# Patient Record
Sex: Female | Born: 1935 | Race: White | Hispanic: No | Smoking: Never smoker
Health system: Southern US, Community
[De-identification: ages and names within clinical notes are randomized; demographics above are authoritative.]

## PROBLEM LIST (undated history)

## (undated) DIAGNOSIS — D649 Anemia, unspecified: Secondary | ICD-10-CM

## (undated) DIAGNOSIS — I219 Acute myocardial infarction, unspecified: Secondary | ICD-10-CM

## (undated) DIAGNOSIS — I509 Heart failure, unspecified: Secondary | ICD-10-CM

## (undated) DIAGNOSIS — E079 Disorder of thyroid, unspecified: Secondary | ICD-10-CM

## (undated) DIAGNOSIS — M1990 Unspecified osteoarthritis, unspecified site: Secondary | ICD-10-CM

## (undated) DIAGNOSIS — K219 Gastro-esophageal reflux disease without esophagitis: Secondary | ICD-10-CM

## (undated) DIAGNOSIS — J449 Chronic obstructive pulmonary disease, unspecified: Secondary | ICD-10-CM

## (undated) DIAGNOSIS — C801 Malignant (primary) neoplasm, unspecified: Secondary | ICD-10-CM

## (undated) HISTORY — PX: CORONARY ANGIOPLASTY WITH STENT PLACEMENT: SHX49

## (undated) HISTORY — PX: MASTECTOMY: SHX3

## (undated) HISTORY — PX: ABDOMINAL HYSTERECTOMY: SHX81

## (undated) HISTORY — PX: APPENDECTOMY: SHX54

## (undated) HISTORY — PX: VESICOVAGINAL FISTULA CLOSURE W/ TAH: SUR271

## (undated) HISTORY — PX: OTHER SURGICAL HISTORY: SHX169

---

## 2004-12-17 ENCOUNTER — Ambulatory Visit: Payer: Self-pay

## 2004-12-31 ENCOUNTER — Ambulatory Visit: Payer: Self-pay | Admitting: Cardiovascular Disease

## 2005-01-04 ENCOUNTER — Inpatient Hospital Stay (HOSPITAL_COMMUNITY): Admission: AD | Admit: 2005-01-04 | Discharge: 2005-01-06 | Payer: Self-pay | Admitting: Cardiology

## 2005-01-04 ENCOUNTER — Ambulatory Visit: Payer: Self-pay | Admitting: Cardiovascular Disease

## 2005-05-30 ENCOUNTER — Ambulatory Visit: Payer: Self-pay | Admitting: Cardiovascular Disease

## 2005-06-01 ENCOUNTER — Ambulatory Visit: Payer: Self-pay | Admitting: Cardiovascular Disease

## 2005-06-01 ENCOUNTER — Inpatient Hospital Stay (HOSPITAL_COMMUNITY): Admission: AD | Admit: 2005-06-01 | Discharge: 2005-06-02 | Payer: Self-pay | Admitting: Cardiology

## 2005-11-01 ENCOUNTER — Other Ambulatory Visit: Payer: Self-pay

## 2005-11-01 ENCOUNTER — Inpatient Hospital Stay: Payer: Self-pay | Admitting: Internal Medicine

## 2005-11-04 ENCOUNTER — Other Ambulatory Visit: Payer: Self-pay

## 2007-01-03 ENCOUNTER — Other Ambulatory Visit: Payer: Self-pay

## 2007-01-03 ENCOUNTER — Emergency Department: Payer: Self-pay | Admitting: Unknown Physician Specialty

## 2007-03-07 ENCOUNTER — Ambulatory Visit: Payer: Self-pay | Admitting: Gastroenterology

## 2007-06-22 ENCOUNTER — Ambulatory Visit: Payer: Self-pay | Admitting: Family Medicine

## 2008-01-11 ENCOUNTER — Ambulatory Visit: Payer: Self-pay | Admitting: Cardiovascular Disease

## 2008-01-11 ENCOUNTER — Other Ambulatory Visit: Payer: Self-pay

## 2008-06-28 ENCOUNTER — Other Ambulatory Visit: Payer: Self-pay

## 2008-06-28 ENCOUNTER — Emergency Department: Payer: Self-pay | Admitting: Emergency Medicine

## 2008-07-15 ENCOUNTER — Ambulatory Visit: Payer: Self-pay | Admitting: Family Medicine

## 2008-10-27 ENCOUNTER — Ambulatory Visit: Payer: Self-pay | Admitting: Family Medicine

## 2009-03-05 ENCOUNTER — Ambulatory Visit: Payer: Self-pay | Admitting: Gastroenterology

## 2009-11-02 ENCOUNTER — Ambulatory Visit: Payer: Self-pay | Admitting: Family Medicine

## 2009-11-16 ENCOUNTER — Ambulatory Visit: Payer: Self-pay | Admitting: Unknown Physician Specialty

## 2010-02-01 ENCOUNTER — Observation Stay: Payer: Self-pay | Admitting: Specialist

## 2010-08-26 ENCOUNTER — Inpatient Hospital Stay: Payer: Self-pay | Admitting: Internal Medicine

## 2011-08-26 ENCOUNTER — Emergency Department: Payer: Self-pay | Admitting: Emergency Medicine

## 2011-12-14 ENCOUNTER — Emergency Department: Payer: Self-pay | Admitting: Emergency Medicine

## 2011-12-14 LAB — COMPREHENSIVE METABOLIC PANEL
Albumin: 3.6 g/dL (ref 3.4–5.0)
Alkaline Phosphatase: 83 U/L (ref 50–136)
Anion Gap: 12 (ref 7–16)
Chloride: 105 mmol/L (ref 98–107)
Creatinine: 2.5 mg/dL — ABNORMAL HIGH (ref 0.60–1.30)
EGFR (African American): 23 — ABNORMAL LOW
SGOT(AST): 14 U/L — ABNORMAL LOW (ref 15–37)
SGPT (ALT): 20 U/L
Sodium: 146 mmol/L — ABNORMAL HIGH (ref 136–145)
Total Protein: 7 g/dL (ref 6.4–8.2)

## 2011-12-14 LAB — CBC
HGB: 10.7 g/dL — ABNORMAL LOW (ref 12.0–16.0)
MCH: 29.6 pg (ref 26.0–34.0)
MCHC: 33.3 g/dL (ref 32.0–36.0)
MCV: 89 fL (ref 80–100)
RBC: 3.61 10*6/uL — ABNORMAL LOW (ref 3.80–5.20)

## 2011-12-14 LAB — URINALYSIS, COMPLETE
Bacteria: NONE SEEN
Bilirubin,UR: NEGATIVE
Blood: NEGATIVE
Glucose,UR: NEGATIVE mg/dL (ref 0–75)
Ketone: NEGATIVE
Leukocyte Esterase: NEGATIVE
Ph: 6 (ref 4.5–8.0)
Protein: NEGATIVE
Specific Gravity: 1.005 (ref 1.003–1.030)
WBC UR: 1 /HPF (ref 0–5)

## 2011-12-14 LAB — PROTIME-INR: Prothrombin Time: 12.4 secs (ref 11.5–14.7)

## 2012-03-04 ENCOUNTER — Inpatient Hospital Stay: Payer: Self-pay | Admitting: Orthopedic Surgery

## 2012-03-04 ENCOUNTER — Ambulatory Visit: Payer: Self-pay | Admitting: Orthopaedic Surgery

## 2012-03-04 LAB — URINALYSIS, COMPLETE
Bacteria: NONE SEEN
Blood: NEGATIVE
Hyaline Cast: 13
Ketone: NEGATIVE
Ph: 5 (ref 4.5–8.0)
RBC,UR: 1 /HPF (ref 0–5)
Specific Gravity: 1.004 (ref 1.003–1.030)
Squamous Epithelial: 1
WBC UR: 4 /HPF (ref 0–5)

## 2012-03-04 LAB — COMPREHENSIVE METABOLIC PANEL
Alkaline Phosphatase: 87 U/L (ref 50–136)
Anion Gap: 11 (ref 7–16)
Bilirubin,Total: 0.4 mg/dL (ref 0.2–1.0)
Calcium, Total: 8.6 mg/dL (ref 8.5–10.1)
Chloride: 103 mmol/L (ref 98–107)
EGFR (African American): 32 — ABNORMAL LOW
EGFR (Non-African Amer.): 26 — ABNORMAL LOW
Glucose: 99 mg/dL (ref 65–99)
Osmolality: 283 (ref 275–301)
SGPT (ALT): 20 U/L
Sodium: 140 mmol/L (ref 136–145)
Total Protein: 6.5 g/dL (ref 6.4–8.2)

## 2012-03-04 LAB — CBC
HCT: 29 % — ABNORMAL LOW (ref 35.0–47.0)
HGB: 9.6 g/dL — ABNORMAL LOW (ref 12.0–16.0)
MCH: 30 pg (ref 26.0–34.0)
Platelet: 203 10*3/uL (ref 150–440)
RDW: 14.9 % — ABNORMAL HIGH (ref 11.5–14.5)
WBC: 6.1 10*3/uL (ref 3.6–11.0)

## 2012-03-04 LAB — PROTIME-INR: Prothrombin Time: 12.3 secs (ref 11.5–14.7)

## 2012-03-05 LAB — HEMOGLOBIN: HGB: 7.9 g/dL — ABNORMAL LOW (ref 12.0–16.0)

## 2012-03-05 LAB — BASIC METABOLIC PANEL
Chloride: 102 mmol/L (ref 98–107)
Creatinine: 1.62 mg/dL — ABNORMAL HIGH (ref 0.60–1.30)
EGFR (African American): 38 — ABNORMAL LOW
EGFR (Non-African Amer.): 31 — ABNORMAL LOW
Glucose: 155 mg/dL — ABNORMAL HIGH (ref 65–99)
Osmolality: 280 (ref 275–301)
Potassium: 3.5 mmol/L (ref 3.5–5.1)

## 2012-03-06 LAB — BASIC METABOLIC PANEL
BUN: 16 mg/dL (ref 7–18)
Calcium, Total: 7.7 mg/dL — ABNORMAL LOW (ref 8.5–10.1)
Chloride: 106 mmol/L (ref 98–107)
Co2: 25 mmol/L (ref 21–32)
Creatinine: 1.12 mg/dL (ref 0.60–1.30)
EGFR (African American): 58 — ABNORMAL LOW
EGFR (Non-African Amer.): 48 — ABNORMAL LOW
Glucose: 160 mg/dL — ABNORMAL HIGH (ref 65–99)
Osmolality: 284 (ref 275–301)
Potassium: 4 mmol/L (ref 3.5–5.1)
Sodium: 140 mmol/L (ref 136–145)

## 2012-03-06 LAB — HEMOGLOBIN: HGB: 8.8 g/dL — ABNORMAL LOW (ref 12.0–16.0)

## 2012-03-06 LAB — PLATELET COUNT: Platelet: 141 10*3/uL — ABNORMAL LOW (ref 150–440)

## 2012-03-07 LAB — BASIC METABOLIC PANEL
BUN: 17 mg/dL (ref 7–18)
Calcium, Total: 8.2 mg/dL — ABNORMAL LOW (ref 8.5–10.1)
Creatinine: 1.35 mg/dL — ABNORMAL HIGH (ref 0.60–1.30)
EGFR (African American): 47 — ABNORMAL LOW
EGFR (Non-African Amer.): 39 — ABNORMAL LOW
Glucose: 139 mg/dL — ABNORMAL HIGH (ref 65–99)
Osmolality: 281 (ref 275–301)
Sodium: 139 mmol/L (ref 136–145)

## 2012-03-07 LAB — PLATELET COUNT: Platelet: 149 10*3/uL — ABNORMAL LOW (ref 150–440)

## 2012-03-08 LAB — BASIC METABOLIC PANEL
Anion Gap: 10 (ref 7–16)
BUN: 16 mg/dL (ref 7–18)
Calcium, Total: 8.1 mg/dL — ABNORMAL LOW (ref 8.5–10.1)
Co2: 26 mmol/L (ref 21–32)
EGFR (African American): 52 — ABNORMAL LOW
EGFR (Non-African Amer.): 45 — ABNORMAL LOW
Osmolality: 284 (ref 275–301)

## 2012-03-09 LAB — CBC WITH DIFFERENTIAL/PLATELET
Basophil %: 0.6 %
Eosinophil #: 0.1 10*3/uL (ref 0.0–0.7)
Eosinophil %: 1.8 %
Lymphocyte #: 0.7 10*3/uL — ABNORMAL LOW (ref 1.0–3.6)
Lymphocyte %: 9 %
MCHC: 33.2 g/dL (ref 32.0–36.0)
MCV: 91 fL (ref 80–100)
Monocyte #: 0.9 x10 3/mm (ref 0.2–0.9)
Neutrophil #: 5.7 10*3/uL (ref 1.4–6.5)
Neutrophil %: 76.8 %
Platelet: 155 10*3/uL (ref 150–440)
RDW: 14.5 % (ref 11.5–14.5)

## 2012-03-10 LAB — BASIC METABOLIC PANEL
Anion Gap: 9 (ref 7–16)
Anion Gap: 9 (ref 7–16)
BUN: 16 mg/dL (ref 7–18)
BUN: 16 mg/dL (ref 7–18)
Calcium, Total: 8 mg/dL — ABNORMAL LOW (ref 8.5–10.1)
Calcium, Total: 8.5 mg/dL (ref 8.5–10.1)
Chloride: 102 mmol/L (ref 98–107)
Chloride: 100 mmol/L (ref 98–107)
Co2: 26 mmol/L (ref 21–32)
Co2: 29 mmol/L (ref 21–32)
Creatinine: 1.05 mg/dL (ref 0.60–1.30)
EGFR (African American): 52 — ABNORMAL LOW
EGFR (African American): 50 — ABNORMAL LOW
EGFR (Non-African Amer.): 45 — ABNORMAL LOW
Glucose: 92 mg/dL (ref 65–99)
Osmolality: 275 (ref 275–301)
Osmolality: 278 (ref 275–301)
Potassium: 4.1 mmol/L (ref 3.5–5.1)
Potassium: 4.1 mmol/L (ref 3.5–5.1)
Sodium: 137 mmol/L (ref 136–145)
Sodium: 138 mmol/L (ref 136–145)

## 2012-03-10 LAB — HEMOGLOBIN: HGB: 8.2 g/dL — ABNORMAL LOW (ref 12.0–16.0)

## 2012-03-11 LAB — URINALYSIS, COMPLETE
Glucose,UR: NEGATIVE mg/dL (ref 0–75)
Protein: NEGATIVE
Specific Gravity: 1.006 (ref 1.003–1.030)
WBC UR: 6 /HPF (ref 0–5)

## 2012-03-11 LAB — HEMOGLOBIN: HGB: 10 g/dL — ABNORMAL LOW (ref 12.0–16.0)

## 2012-06-04 ENCOUNTER — Emergency Department: Payer: Self-pay | Admitting: Emergency Medicine

## 2012-06-04 LAB — COMPREHENSIVE METABOLIC PANEL
Albumin: 4.1 g/dL (ref 3.4–5.0)
Alkaline Phosphatase: 99 U/L (ref 50–136)
Calcium, Total: 9.3 mg/dL (ref 8.5–10.1)
Chloride: 103 mmol/L (ref 98–107)
Co2: 29 mmol/L (ref 21–32)
Creatinine: 0.87 mg/dL (ref 0.60–1.30)
EGFR (African American): >60
Glucose: 107 mg/dL — ABNORMAL HIGH (ref 65–99)
Potassium: 4.3 mmol/L (ref 3.5–5.1)
SGOT(AST): 24 U/L (ref 15–37)
SGPT (ALT): 22 U/L

## 2012-06-04 LAB — URINALYSIS, COMPLETE
Bilirubin,UR: NEGATIVE
Ph: 7 (ref 4.5–8.0)
Protein: NEGATIVE
RBC,UR: <1 /HPF (ref 0–5)
Squamous Epithelial: <1
Transitional Epi: <1
WBC UR: 2 /HPF (ref 0–5)

## 2012-06-04 LAB — CBC
HGB: 13 g/dL (ref 12.0–16.0)
Platelet: 114 10*3/uL — ABNORMAL LOW (ref 150–440)
RBC: 4.31 10*6/uL (ref 3.80–5.20)
RDW: 13.2 % (ref 11.5–14.5)

## 2012-06-04 LAB — CK TOTAL AND CKMB (NOT AT ARMC): CK, Total: 25 U/L (ref 21–215)

## 2014-01-06 ENCOUNTER — Inpatient Hospital Stay: Payer: Self-pay | Admitting: Internal Medicine

## 2014-01-06 LAB — COMPREHENSIVE METABOLIC PANEL
ALK PHOS: 84 U/L
ANION GAP: 6 — AB (ref 7–16)
AST: 15 U/L (ref 15–37)
Albumin: 3.3 g/dL — ABNORMAL LOW (ref 3.4–5.0)
BILIRUBIN TOTAL: 0.3 mg/dL (ref 0.2–1.0)
BUN: 29 mg/dL — AB (ref 7–18)
CHLORIDE: 106 mmol/L (ref 98–107)
Calcium, Total: 8.8 mg/dL (ref 8.5–10.1)
Co2: 29 mmol/L (ref 21–32)
Creatinine: 1.46 mg/dL — ABNORMAL HIGH (ref 0.60–1.30)
EGFR (Non-African Amer.): 30 — ABNORMAL LOW
GFR CALC AF AMER: 34 — AB
GLUCOSE: 108 mg/dL — AB (ref 65–99)
Osmolality: 288 (ref 275–301)
Potassium: 3.9 mmol/L (ref 3.5–5.1)
SGPT (ALT): 18 U/L (ref 12–78)
SODIUM: 141 mmol/L (ref 136–145)
Total Protein: 6.8 g/dL (ref 6.4–8.2)

## 2014-01-06 LAB — CBC
HCT: 34.4 % — ABNORMAL LOW (ref 35.0–47.0)
HGB: 11.1 g/dL — AB (ref 12.0–16.0)
MCH: 28.6 pg (ref 26.0–34.0)
MCHC: 32.2 g/dL (ref 32.0–36.0)
MCV: 89 fL (ref 80–100)
Platelet: 226 10*3/uL (ref 150–440)
RBC: 3.87 10*6/uL (ref 3.80–5.20)
RDW: 14.6 % — AB (ref 11.5–14.5)
WBC: 6.4 10*3/uL (ref 3.6–11.0)

## 2014-01-06 LAB — CK TOTAL AND CKMB (NOT AT ARMC)
CK, Total: 36 U/L
CK-MB: 1 ng/mL (ref 0.5–3.6)

## 2014-01-06 LAB — PROTIME-INR
INR: 1.1
Prothrombin Time: 13.8 secs (ref 11.5–14.7)

## 2014-01-06 LAB — APTT: ACTIVATED PTT: 33.5 s (ref 23.6–35.9)

## 2014-01-06 LAB — TROPONIN I
TROPONIN-I: 0.06 ng/mL — AB
Troponin-I: 0.05 ng/mL

## 2014-01-07 LAB — TROPONIN I: Troponin-I: 0.06 ng/mL — ABNORMAL HIGH

## 2014-01-07 LAB — HEMOGLOBIN
HGB: 9.1 g/dL — AB (ref 12.0–16.0)
HGB: 8.8 g/dL — AB (ref 12.0–16.0)
HGB: 9.3 g/dL — ABNORMAL LOW (ref 12.0–16.0)

## 2014-01-07 LAB — CBC WITH DIFFERENTIAL/PLATELET
BASOS PCT: 1.1 %
Basophil #: 0.1 10*3/uL (ref 0.0–0.1)
Eosinophil #: 0.1 10*3/uL (ref 0.0–0.7)
Eosinophil %: 1.8 %
HCT: 31.3 % — AB (ref 35.0–47.0)
HGB: 10.4 g/dL — ABNORMAL LOW (ref 12.0–16.0)
LYMPHS PCT: 26.4 %
Lymphocyte #: 1.8 10*3/uL (ref 1.0–3.6)
MCH: 29.5 pg (ref 26.0–34.0)
MCHC: 33.3 g/dL (ref 32.0–36.0)
MCV: 89 fL (ref 80–100)
Monocyte #: 0.7 x10 3/mm (ref 0.2–0.9)
Monocyte %: 10.9 %
NEUTROS ABS: 4 10*3/uL (ref 1.4–6.5)
Neutrophil %: 59.8 %
PLATELETS: 208 10*3/uL (ref 150–440)
RBC: 3.53 10*6/uL — AB (ref 3.80–5.20)
RDW: 14.7 % — AB (ref 11.5–14.5)
WBC: 6.6 10*3/uL (ref 3.6–11.0)

## 2014-01-07 LAB — CK-MB: CK-MB: 0.7 ng/mL (ref 0.5–3.6)

## 2014-01-08 LAB — CBC WITH DIFFERENTIAL/PLATELET
BASOS ABS: 0 10*3/uL (ref 0.0–0.1)
Basophil %: 0.7 %
Eosinophil #: 0.2 10*3/uL (ref 0.0–0.7)
Eosinophil %: 2.5 %
HCT: 25.9 % — ABNORMAL LOW (ref 35.0–47.0)
HGB: 8.8 g/dL — AB (ref 12.0–16.0)
LYMPHS PCT: 19.8 %
Lymphocyte #: 1.2 10*3/uL (ref 1.0–3.6)
MCH: 30.3 pg (ref 26.0–34.0)
MCHC: 34.1 g/dL (ref 32.0–36.0)
MCV: 89 fL (ref 80–100)
MONOS PCT: 10.3 %
Monocyte #: 0.6 x10 3/mm (ref 0.2–0.9)
NEUTROS ABS: 4.2 10*3/uL (ref 1.4–6.5)
Neutrophil %: 66.7 %
PLATELETS: 188 10*3/uL (ref 150–440)
RBC: 2.92 10*6/uL — ABNORMAL LOW (ref 3.80–5.20)
RDW: 14.2 % (ref 11.5–14.5)
WBC: 6.3 10*3/uL (ref 3.6–11.0)

## 2014-01-09 LAB — HEMOGLOBIN
HGB: 7.7 g/dL — AB (ref 12.0–16.0)
HGB: 7.7 g/dL — ABNORMAL LOW (ref 12.0–16.0)

## 2014-01-09 LAB — CREATININE, SERUM
CREATININE: 1.1 mg/dL (ref 0.60–1.30)
EGFR (African American): 48 — ABNORMAL LOW
EGFR (Non-African Amer.): 42 — ABNORMAL LOW

## 2014-01-10 ENCOUNTER — Ambulatory Visit: Payer: Self-pay | Admitting: Hospice and Palliative Medicine

## 2014-01-10 LAB — HEMOGLOBIN: HGB: 8.2 g/dL — AB (ref 12.0–16.0)

## 2014-01-11 LAB — HEMOGLOBIN: HGB: 8.4 g/dL — AB (ref 12.0–16.0)

## 2014-01-12 LAB — HEMOGLOBIN: HGB: 7.4 g/dL — AB (ref 12.0–16.0)

## 2014-01-13 LAB — HEMOGLOBIN: HGB: 7.5 g/dL — AB (ref 12.0–16.0)

## 2014-01-14 LAB — HEMOGLOBIN: HGB: 9.7 g/dL — AB (ref 12.0–16.0)

## 2014-01-26 ENCOUNTER — Ambulatory Visit: Payer: Self-pay | Admitting: Hospice and Palliative Medicine

## 2014-05-14 ENCOUNTER — Emergency Department: Payer: Self-pay | Admitting: Emergency Medicine

## 2015-02-20 LAB — URINALYSIS, COMPLETE
Bilirubin,UR: NEGATIVE
Glucose,UR: NEGATIVE mg/dL (ref 0–75)
Hyaline Cast: 2
KETONE: NEGATIVE
Nitrite: NEGATIVE
PROTEIN: NEGATIVE
Ph: 5 (ref 4.5–8.0)
RBC, UR: NONE SEEN /HPF (ref 0–5)
Specific Gravity: 1.009 (ref 1.003–1.030)
Squamous Epithelial: NONE SEEN

## 2015-02-20 LAB — BASIC METABOLIC PANEL
Anion Gap: 6 — ABNORMAL LOW (ref 7–16)
BUN: 27 mg/dL — ABNORMAL HIGH
CALCIUM: 8.9 mg/dL
CO2: 28 mmol/L
CREATININE: 1.21 mg/dL — AB
Chloride: 104 mmol/L
EGFR (African American): 43 — ABNORMAL LOW
EGFR (Non-African Amer.): 37 — ABNORMAL LOW
GLUCOSE: 109 mg/dL — AB
Potassium: 4 mmol/L
SODIUM: 138 mmol/L

## 2015-02-20 LAB — TROPONIN I: Troponin-I: <0.03 ng/mL

## 2015-02-20 LAB — CBC
HCT: 34.1 % — AB (ref 35.0–47.0)
HGB: 11 g/dL — AB (ref 12.0–16.0)
MCH: 29.2 pg (ref 26.0–34.0)
MCHC: 32.3 g/dL (ref 32.0–36.0)
MCV: 91 fL (ref 80–100)
Platelet: 205 10*3/uL (ref 150–440)
RBC: 3.77 10*6/uL — AB (ref 3.80–5.20)
RDW: 14.4 % (ref 11.5–14.5)
WBC: 7.2 10*3/uL (ref 3.6–11.0)

## 2015-02-20 LAB — PRO B NATRIURETIC PEPTIDE: B-Type Natriuretic Peptide: 189 pg/mL — ABNORMAL HIGH

## 2015-02-21 ENCOUNTER — Inpatient Hospital Stay
Admit: 2015-02-21 | Disposition: A | Discharge status: Home / Self Care | Payer: Self-pay | Attending: Surgery | Admitting: Surgery

## 2015-02-22 LAB — BASIC METABOLIC PANEL
Anion Gap: 9 (ref 7–16)
BUN: 25 mg/dL — ABNORMAL HIGH
CALCIUM: 8.4 mg/dL — AB
CHLORIDE: 99 mmol/L — AB
CO2: 30 mmol/L
CREATININE: 1.34 mg/dL — AB
EGFR (African American): 38 — ABNORMAL LOW
GFR CALC NON AF AMER: 33 — AB
GLUCOSE: 142 mg/dL — AB
Potassium: 3.9 mmol/L
Sodium: 138 mmol/L

## 2015-02-22 LAB — CBC WITH DIFFERENTIAL/PLATELET
BASOS ABS: 0 10*3/uL (ref 0.0–0.1)
Basophil %: 0 %
EOS PCT: 0 %
Eosinophil #: 0 10*3/uL (ref 0.0–0.7)
HCT: 32.2 % — ABNORMAL LOW (ref 35.0–47.0)
HGB: 10.4 g/dL — AB (ref 12.0–16.0)
LYMPHS PCT: 6.3 %
Lymphocyte #: 1 10*3/uL (ref 1.0–3.6)
MCH: 29 pg (ref 26.0–34.0)
MCHC: 32.2 g/dL (ref 32.0–36.0)
MCV: 90 fL (ref 80–100)
MONOS PCT: 9.3 %
Monocyte #: 1.5 x10 3/mm — ABNORMAL HIGH (ref 0.2–0.9)
Neutrophil #: 14 10*3/uL — ABNORMAL HIGH (ref 1.4–6.5)
Neutrophil %: 84.4 %
Platelet: 195 10*3/uL (ref 150–440)
RBC: 3.57 10*6/uL — ABNORMAL LOW (ref 3.80–5.20)
RDW: 14.5 % (ref 11.5–14.5)
WBC: 16.6 10*3/uL — ABNORMAL HIGH (ref 3.6–11.0)

## 2015-02-23 LAB — BASIC METABOLIC PANEL
Anion Gap: 6 — ABNORMAL LOW (ref 7–16)
BUN: 19 mg/dL
CREATININE: 1.02 mg/dL — AB
Calcium, Total: 8.6 mg/dL — ABNORMAL LOW
Chloride: 102 mmol/L
Co2: 31 mmol/L
GFR CALC AF AMER: 53 — AB
GFR CALC NON AF AMER: 45 — AB
Glucose: 151 mg/dL — ABNORMAL HIGH
Potassium: 4.3 mmol/L
Sodium: 139 mmol/L

## 2015-02-23 LAB — CBC WITH DIFFERENTIAL/PLATELET
BASOS ABS: 0 10*3/uL (ref 0.0–0.1)
Basophil %: 0.2 %
Eosinophil #: 0.2 10*3/uL (ref 0.0–0.7)
Eosinophil %: 1.6 %
HCT: 35.7 % (ref 35.0–47.0)
HGB: 11.4 g/dL — ABNORMAL LOW (ref 12.0–16.0)
LYMPHS PCT: 14.4 %
Lymphocyte #: 1.6 10*3/uL (ref 1.0–3.6)
MCH: 29.3 pg (ref 26.0–34.0)
MCHC: 31.8 g/dL — AB (ref 32.0–36.0)
MCV: 92 fL (ref 80–100)
MONOS PCT: 11.3 %
Monocyte #: 1.3 x10 3/mm — ABNORMAL HIGH (ref 0.2–0.9)
Neutrophil #: 8.2 10*3/uL — ABNORMAL HIGH (ref 1.4–6.5)
Neutrophil %: 72.5 %
PLATELETS: 219 10*3/uL (ref 150–440)
RBC: 3.88 10*6/uL (ref 3.80–5.20)
RDW: 14.6 % — AB (ref 11.5–14.5)
WBC: 11.3 10*3/uL — ABNORMAL HIGH (ref 3.6–11.0)

## 2015-02-24 LAB — URINALYSIS, COMPLETE
Bilirubin,UR: NEGATIVE
Glucose,UR: NEGATIVE mg/dL (ref 0–75)
Hyaline Cast: 2
Ketone: NEGATIVE
NITRITE: POSITIVE
PH: 5 (ref 4.5–8.0)
PROTEIN: NEGATIVE
RBC,UR: 1 /HPF (ref 0–5)
SPECIFIC GRAVITY: 1.011 (ref 1.003–1.030)
Squamous Epithelial: 1
WBC UR: 15 /HPF (ref 0–5)

## 2015-02-26 LAB — CBC WITH DIFFERENTIAL/PLATELET
BASOS ABS: 0 10*3/uL (ref 0.0–0.1)
BASOS PCT: 0.1 %
Eosinophil #: 0 10*3/uL (ref 0.0–0.7)
Eosinophil %: 0.3 %
HCT: 35.6 % (ref 35.0–47.0)
HGB: 11.5 g/dL — AB (ref 12.0–16.0)
LYMPHS ABS: 0.6 10*3/uL — AB (ref 1.0–3.6)
LYMPHS PCT: 4.4 %
MCH: 29.1 pg (ref 26.0–34.0)
MCHC: 32.3 g/dL (ref 32.0–36.0)
MCV: 90 fL (ref 80–100)
Monocyte #: 1.6 x10 3/mm — ABNORMAL HIGH (ref 0.2–0.9)
Monocyte %: 10.7 %
NEUTROS ABS: 12.2 10*3/uL — AB (ref 1.4–6.5)
Neutrophil %: 84.5 %
Platelet: 256 10*3/uL (ref 150–440)
RBC: 3.96 10*6/uL (ref 3.80–5.20)
RDW: 14.5 % (ref 11.5–14.5)
WBC: 14.5 10*3/uL — ABNORMAL HIGH (ref 3.6–11.0)

## 2015-02-26 LAB — COMPREHENSIVE METABOLIC PANEL
Albumin: 3.5 g/dL
Alkaline Phosphatase: 80 U/L
Anion Gap: 5 — ABNORMAL LOW (ref 7–16)
BILIRUBIN TOTAL: 0.6 mg/dL
BUN: 18 mg/dL
CO2: 23 mmol/L
Calcium, Total: 8.4 mg/dL — ABNORMAL LOW
Chloride: 104 mmol/L
Creatinine: 1.04 mg/dL — ABNORMAL HIGH
EGFR (African American): 51 — ABNORMAL LOW
GFR CALC NON AF AMER: 44 — AB
Glucose: 177 mg/dL — ABNORMAL HIGH
POTASSIUM: 4.6 mmol/L
SGOT(AST): 23 U/L
SGPT (ALT): 34 U/L
SODIUM: 132 mmol/L — AB
Total Protein: 6.6 g/dL

## 2015-02-26 LAB — PROTIME-INR
INR: 1.1
PROTHROMBIN TIME: 14 s

## 2015-02-26 LAB — APTT: ACTIVATED PTT: 28.6 s (ref 23.6–35.9)

## 2015-02-27 LAB — URINE CULTURE

## 2015-03-01 LAB — COMPREHENSIVE METABOLIC PANEL
ALBUMIN: 2.8 g/dL — AB
ALK PHOS: 66 U/L
ALT: 19 U/L
ANION GAP: 0 — AB (ref 7–16)
BILIRUBIN TOTAL: 0.5 mg/dL
BUN: 8 mg/dL
CHLORIDE: 110 mmol/L
CREATININE: 0.9 mg/dL
Calcium, Total: 7.9 mg/dL — ABNORMAL LOW
Co2: 27 mmol/L
EGFR (African American): >60
EGFR (Non-African Amer.): 53 — ABNORMAL LOW
Glucose: 132 mg/dL — ABNORMAL HIGH
POTASSIUM: 4 mmol/L
SGOT(AST): 15 U/L
SODIUM: 137 mmol/L
Total Protein: 5.5 g/dL — ABNORMAL LOW

## 2015-03-01 LAB — CBC WITH DIFFERENTIAL/PLATELET
Basophil #: 0 10*3/uL (ref 0.0–0.1)
Basophil %: 0.6 %
EOS ABS: 0.2 10*3/uL (ref 0.0–0.7)
EOS PCT: 2.5 %
HCT: 31.6 % — ABNORMAL LOW (ref 35.0–47.0)
HGB: 10.1 g/dL — ABNORMAL LOW (ref 12.0–16.0)
LYMPHS PCT: 13.6 %
Lymphocyte #: 1 10*3/uL (ref 1.0–3.6)
MCH: 29.2 pg (ref 26.0–34.0)
MCHC: 31.9 g/dL — ABNORMAL LOW (ref 32.0–36.0)
MCV: 92 fL (ref 80–100)
MONOS PCT: 14.7 %
Monocyte #: 1 x10 3/mm — ABNORMAL HIGH (ref 0.2–0.9)
NEUTROS PCT: 68.6 %
Neutrophil #: 4.8 10*3/uL (ref 1.4–6.5)
PLATELETS: 234 10*3/uL (ref 150–440)
RBC: 3.45 10*6/uL — AB (ref 3.80–5.20)
RDW: 14.4 % (ref 11.5–14.5)
WBC: 7 10*3/uL (ref 3.6–11.0)

## 2015-03-01 LAB — AMYLASE: AMYLASE: 15 U/L — AB

## 2015-03-01 LAB — MAGNESIUM: MAGNESIUM: 1.9 mg/dL

## 2015-03-04 ENCOUNTER — Inpatient Hospital Stay
Admit: 2015-03-04 | Disposition: A | Discharge status: Skilled Nursing Facility | Payer: Self-pay | Attending: Surgery | Admitting: Surgery

## 2015-03-04 LAB — COMPREHENSIVE METABOLIC PANEL
AST: 21 U/L
Albumin: 3.4 g/dL — ABNORMAL LOW
Alkaline Phosphatase: 77 U/L
Anion Gap: 10 (ref 7–16)
BILIRUBIN TOTAL: 0.4 mg/dL
BUN: 17 mg/dL
CO2: 31 mmol/L
Calcium, Total: 8.4 mg/dL — ABNORMAL LOW
Chloride: 100 mmol/L — ABNORMAL LOW
Creatinine: 1.34 mg/dL — ABNORMAL HIGH
GFR CALC AF AMER: 38 — AB
GFR CALC NON AF AMER: 33 — AB
GLUCOSE: 119 mg/dL — AB
Potassium: 3.6 mmol/L
SGPT (ALT): 20 U/L
Sodium: 141 mmol/L
TOTAL PROTEIN: 6.5 g/dL

## 2015-03-04 LAB — LACTIC ACID, PLASMA: Lactic Acid, Venous: 1.1 mmol/L

## 2015-03-04 LAB — CBC WITH DIFFERENTIAL/PLATELET
BASOS PCT: 0.2 %
Basophil #: 0.1 10*3/uL (ref 0.0–0.1)
EOS PCT: 0 %
Eosinophil #: 0 10*3/uL (ref 0.0–0.7)
HCT: 35.7 % (ref 35.0–47.0)
HGB: 11.6 g/dL — AB (ref 12.0–16.0)
LYMPHS PCT: 5 %
Lymphocyte #: 1.3 10*3/uL (ref 1.0–3.6)
MCH: 29 pg (ref 26.0–34.0)
MCHC: 32.4 g/dL (ref 32.0–36.0)
MCV: 89 fL (ref 80–100)
MONOS PCT: 5 %
Monocyte #: 1.3 x10 3/mm — ABNORMAL HIGH (ref 0.2–0.9)
NEUTROS PCT: 89.8 %
Neutrophil #: 22.7 10*3/uL — ABNORMAL HIGH (ref 1.4–6.5)
PLATELETS: 306 10*3/uL (ref 150–440)
RBC: 3.99 10*6/uL (ref 3.80–5.20)
RDW: 13.8 % (ref 11.5–14.5)
WBC: 25.3 10*3/uL — AB (ref 3.6–11.0)

## 2015-03-04 LAB — URINALYSIS, COMPLETE
Bilirubin,UR: NEGATIVE
GLUCOSE, UR: NEGATIVE mg/dL (ref 0–75)
Hyaline Cast: 5
Ketone: NEGATIVE
Leukocyte Esterase: NEGATIVE
NITRITE: NEGATIVE
PH: 6 (ref 4.5–8.0)
Protein: NEGATIVE
Specific Gravity: 1.014 (ref 1.003–1.030)
Squamous Epithelial: 1
WBC UR: 2 /HPF (ref 0–5)

## 2015-03-04 LAB — TSH: THYROID STIMULATING HORM: 4.52 u[IU]/mL — AB

## 2015-03-04 LAB — LIPASE, BLOOD: Lipase: 46 U/L

## 2015-03-04 LAB — TROPONIN I: TROPONIN-I: 0.04 ng/mL — AB

## 2015-03-05 LAB — CBC WITH DIFFERENTIAL/PLATELET
Basophil #: 0 10*3/uL (ref 0.0–0.1)
Basophil %: 0 %
EOS PCT: 0.1 %
Eosinophil #: 0 10*3/uL (ref 0.0–0.7)
HCT: 30.3 % — ABNORMAL LOW (ref 35.0–47.0)
HGB: 9.8 g/dL — ABNORMAL LOW (ref 12.0–16.0)
Lymphocyte #: 1.2 10*3/uL (ref 1.0–3.6)
Lymphocyte %: 6 %
MCH: 29.1 pg (ref 26.0–34.0)
MCHC: 32.4 g/dL (ref 32.0–36.0)
MCV: 90 fL (ref 80–100)
MONOS PCT: 7.1 %
Monocyte #: 1.4 x10 3/mm — ABNORMAL HIGH (ref 0.2–0.9)
Neutrophil #: 17.1 10*3/uL — ABNORMAL HIGH (ref 1.4–6.5)
Neutrophil %: 86.8 %
PLATELETS: 262 10*3/uL (ref 150–440)
RBC: 3.37 10*6/uL — AB (ref 3.80–5.20)
RDW: 13.5 % (ref 11.5–14.5)
WBC: 19.7 10*3/uL — AB (ref 3.6–11.0)

## 2015-03-05 LAB — BASIC METABOLIC PANEL
ANION GAP: 8 (ref 7–16)
BUN: 19 mg/dL
CO2: 31 mmol/L
Calcium, Total: 7.6 mg/dL — ABNORMAL LOW
Chloride: 101 mmol/L
Creatinine: 1.15 mg/dL — ABNORMAL HIGH
GFR CALC AF AMER: 46 — AB
GFR CALC NON AF AMER: 39 — AB
Glucose: 129 mg/dL — ABNORMAL HIGH
Potassium: 3.6 mmol/L
Sodium: 140 mmol/L

## 2015-03-05 LAB — TROPONIN I
Troponin-I: 0.03 ng/mL
Troponin-I: 0.03 ng/mL

## 2015-03-07 LAB — CBC WITH DIFFERENTIAL/PLATELET
BASOS PCT: 0.2 %
Basophil #: 0 10*3/uL (ref 0.0–0.1)
EOS PCT: 0.1 %
Eosinophil #: 0 10*3/uL (ref 0.0–0.7)
HCT: 31.5 % — AB (ref 35.0–47.0)
HGB: 10 g/dL — AB (ref 12.0–16.0)
LYMPHS PCT: 5.3 %
Lymphocyte #: 0.8 10*3/uL — ABNORMAL LOW (ref 1.0–3.6)
MCH: 28.9 pg (ref 26.0–34.0)
MCHC: 31.8 g/dL — ABNORMAL LOW (ref 32.0–36.0)
MCV: 91 fL (ref 80–100)
MONO ABS: 1.2 x10 3/mm — AB (ref 0.2–0.9)
Monocyte %: 8 %
Neutrophil #: 13.1 10*3/uL — ABNORMAL HIGH (ref 1.4–6.5)
Neutrophil %: 86.4 %
Platelet: 281 10*3/uL (ref 150–440)
RBC: 3.45 10*6/uL — AB (ref 3.80–5.20)
RDW: 14.1 % (ref 11.5–14.5)
WBC: 15.1 10*3/uL — ABNORMAL HIGH (ref 3.6–11.0)

## 2015-03-07 LAB — BASIC METABOLIC PANEL
Anion Gap: 6 — ABNORMAL LOW (ref 7–16)
BUN: 11 mg/dL
CO2: 27 mmol/L
CREATININE: 0.82 mg/dL
Calcium, Total: 7.7 mg/dL — ABNORMAL LOW
Chloride: 106 mmol/L
EGFR (African American): >60
GFR CALC NON AF AMER: 59 — AB
Glucose: 169 mg/dL — ABNORMAL HIGH
Potassium: 4 mmol/L
Sodium: 139 mmol/L

## 2015-03-07 LAB — COMPREHENSIVE METABOLIC PANEL
ALT: 15 U/L
Albumin: 2.5 g/dL — ABNORMAL LOW
Alkaline Phosphatase: 62 U/L
Bilirubin,Total: 0.3 mg/dL
SGOT(AST): 20 U/L
TOTAL PROTEIN: 5.2 g/dL — AB

## 2015-03-08 LAB — CBC WITH DIFFERENTIAL/PLATELET
BASOS ABS: 0 10*3/uL (ref 0.0–0.1)
BASOS PCT: 0.1 %
Eosinophil #: 0.1 10*3/uL (ref 0.0–0.7)
Eosinophil %: 0.7 %
HCT: 31.9 % — AB (ref 35.0–47.0)
HGB: 10.2 g/dL — AB (ref 12.0–16.0)
LYMPHS ABS: 0.8 10*3/uL — AB (ref 1.0–3.6)
Lymphocyte %: 5.9 %
MCH: 28.9 pg (ref 26.0–34.0)
MCHC: 31.9 g/dL — AB (ref 32.0–36.0)
MCV: 91 fL (ref 80–100)
Monocyte #: 1.2 x10 3/mm — ABNORMAL HIGH (ref 0.2–0.9)
Monocyte %: 8.9 %
NEUTROS ABS: 11.5 10*3/uL — AB (ref 1.4–6.5)
Neutrophil %: 84.4 %
Platelet: 282 10*3/uL (ref 150–440)
RBC: 3.52 10*6/uL — ABNORMAL LOW (ref 3.80–5.20)
RDW: 13.9 % (ref 11.5–14.5)
WBC: 13.7 10*3/uL — ABNORMAL HIGH (ref 3.6–11.0)

## 2015-03-09 LAB — BASIC METABOLIC PANEL
ANION GAP: 7 (ref 7–16)
BUN: 5 mg/dL — ABNORMAL LOW
CALCIUM: 8.3 mg/dL — AB
CO2: 28 mmol/L
Chloride: 106 mmol/L
Creatinine: 0.76 mg/dL
EGFR (Non-African Amer.): >60
GLUCOSE: 111 mg/dL — AB
Potassium: 4.2 mmol/L
SODIUM: 141 mmol/L

## 2015-03-09 LAB — CBC WITH DIFFERENTIAL/PLATELET
BASOS PCT: 0.2 %
Basophil #: 0 10*3/uL (ref 0.0–0.1)
Eosinophil #: 0.2 10*3/uL (ref 0.0–0.7)
Eosinophil %: 2 %
HCT: 32.1 % — ABNORMAL LOW (ref 35.0–47.0)
HGB: 10.2 g/dL — AB (ref 12.0–16.0)
Lymphocyte #: 1.2 10*3/uL (ref 1.0–3.6)
Lymphocyte %: 10.1 %
MCH: 28.9 pg (ref 26.0–34.0)
MCHC: 31.9 g/dL — ABNORMAL LOW (ref 32.0–36.0)
MCV: 91 fL (ref 80–100)
MONOS PCT: 8.4 %
Monocyte #: 1 x10 3/mm — ABNORMAL HIGH (ref 0.2–0.9)
Neutrophil #: 9.1 10*3/uL — ABNORMAL HIGH (ref 1.4–6.5)
Neutrophil %: 79.3 %
Platelet: 275 10*3/uL (ref 150–440)
RBC: 3.55 10*6/uL — ABNORMAL LOW (ref 3.80–5.20)
RDW: 13.7 % (ref 11.5–14.5)
WBC: 11.4 10*3/uL — AB (ref 3.6–11.0)

## 2015-03-21 NOTE — Consult Note (Signed)
PATIENT NAME:  Kristen Mcdonald, DEARCOS MR#:  [id number] DATE OF BIRTH:  1913-05-28  DATE OF CONSULTATION:  01/07/2014  CONSULTING PHYSICIAN:  Manya Silvas, MD  HISTORY OF PRESENT ILLNESS: The patient is a 81 year old white female who was admitted for multiple episodes of bright red blood per rectum. I was asked to see her in consultation.   The patient was seen with 2 family members in the room. She is awake, alert, oriented, somewhat hard of hearing, but can hear me when I raise my voice to take a medical history.   The patient has never had a colonoscopy. She has never had rectal bleeding like this before.   PAST MEDICAL HISTORY:  1. Hypertension.  2. Hyperlipidemia.  3. Coronary artery disease, previous stents.  4. Atrial fibrillation.  5. Chronic back pain.  6. Chronic knee pain.  7. Hypothyroidism.  8. Congestive heart failure.  9. Chronic kidney disease.  10. Anemia of chronic disease.  11. GERD.  12. Breast cancer status post mastectomies.   ALLERGIES: No known drug allergies.   MEDICATIONS:  1. VESIcare 5 mg once a day.  2. Ranitidine 150 mg 2 times a day.  3. Potassium chloride 10 mEq a day.  4. Multivitamins 1 a day.  5. Levothyroxine 75 mcg once a day.  6. Lasix 40 mg a day. 7. Plavix 75 mg a day.  8. Atorvastatin 10 mg a day.  9. Aspirin 81 mg a day.  10. Amiodarone 200 mg a day.   PAST SURGICAL HISTORY:  1. Bilateral mastectomy.  2. Hysterectomy.  3. Appendectomy.  4. Rotator cuff tear.  5. Stent placement.  REVIEW OF SYSTEMS: She currently denies any nausea or vomiting. No chest pains. No shortness of breath. The nurse reported that the patient ambulated in the CCU with assistance without any dizziness or syncope.    PHYSICAL EXAMINATION:  GENERAL: Elderly white female who looks somewhat pale. Conjunctivae appears somewhat pale.  VITAL SIGNS: Temperature 98, pulse 60, blood pressure 88/44, pulse oximetry 96% on room air.  HEENT: Sclerae nonicteric.  Conjunctivae slightly pale. Tongue slightly pale. The head is atraumatic.  CHEST: Clear anterior fields.  HEART: Shows a 2/6 systolic murmur. The neck also showed a systolic murmur in both carotids.  ABDOMEN: Soft. Bowel sounds present. No hepatosplenomegaly. No masses. No bruits. No significant tenderness.  EXTREMITIES: No edema.  SKIN: Warm and dry.  PSYCHIATRIC: Mood and affect are appropriate, pleasant and alert, answering questions appropriately.  RECTAL: Exam not done at this time.   LABORATORY DATA: Glucose 108, BUN 29, creatinine 1.46, sodium 141, potassium 3.9, chloride 106, CO2 29. Liver panel normal except for albumin of 3.3. Troponin 0.06, CPK-MB 1. White count 6.6, hemoglobin 10.4, this was at 7:50 a.m., platelet count 208. Admission hemoglobin was 11.1. She has A positive blood with a negative antibody screen. PT of 13.8.   ASSESSMENT: Lower gastrointestinal bleeding. The patient looks paler than her hemoglobin would indicate. I am going to repeat a STAT hemoglobin and make sure that this has not fallen significantly since her previous laboratory tests. She did get up and ambulate without lightheadedness or dizziness, which is encouraging. The most likely cause for lower gastrointestinal bleed would be diverticular bleed of self-limited duration. Given her family history of colon cancer and the fact that she has never had a colonoscopy before, there is certainly a possibility of a neoplasm present that could have bled. Internal hemorrhoidal bleeding is also possibility. AVMs are possible as well.  RECOMMENDATIONS: A STAT hemoglobin. Follow along, and 80% of diverticular bleeding will quit on its own, and since this is the most likely cause, we will observe her for now. Given her age of 48, I would prefer not to do a colonoscopy. It is of note that she is a no code as well.    ____________________________ Manya Silvas, MD rte:lb D: 01/07/2014 12:53:07 ET T: 01/07/2014 13:10:37  ET JOB#: [id number]  cc: Manya Silvas, MD, <Dictator> Dionisio David, MD Manya Silvas MD ELECTRONICALLY SIGNED 02/06/2014 16:37

## 2015-03-21 NOTE — H&P (Signed)
PATIENT NAME:  KALYNN, DECLERCQ MR#:  [id number] DATE OF BIRTH:  09-01-1913  DATE OF ADMISSION:  01/06/2014  PRIMARY CARE PHYSICIAN: Dr. Neoma Laming.   REFERRING PHYSICIAN: Dr. Marsa Aris.   CHIEF COMPLAINT: Bright red blood per rectum.   HISTORY OF PRESENT ILLNESS: Ms. Geck is a 81 year old pleasant female with a history of congestive heart failure diastolic, coronary artery disease status post PTCA, hypertension, hyperlipidemia, hypothyroidism. She is brought to the Emergency Department after the patient was found to have multiple episodes of bright red blood per rectum. The patient denies having any nausea or vomiting. The patient is extremely hard of hearing. Per emergency staff, the patient was noted to have bright red blood to clots. Denies having any abdominal pain. As mentioned above, denies having any nausea or vomiting. In the Emergency Department, the patient is noted to have hemoglobin of 11.1. The patient's blood pressure remained stable throughout the stay in the Emergency Department.   PAST MEDICAL HISTORY:  1. Hypertension.  2. Hyperlipidemia.  3. Coronary artery disease, status post stent placement.  4. Atrial fibrillation.  5. Chronic back pain.  6. Chronic knee pain.  7. Hypothyroidism.  8. Congestive heart failure.  9. Chronic kidney disease stage IV.  10. Anemia of chronic disease.  11. Gastroesophageal reflux disease.  12. Breast cancer, status post mastectomy.   ALLERGIES: No known drug allergies.   HOME MEDICATIONS:  1. Vesicare 5 mg once a day.  2. Ranitidine 150 mg 2 times a day.  3. Potassium chloride 10 mEq once a day.  4. Multivitamin once a day.  5. Levothyroxine 75 mcg once a day.  6. Lasix 40 mg once a day.  7. Plavix 75 mg once a day.  8. Calcium with vitamin D 2 times a day.  9. Atorvastatin 10 mg once a day.  10. Aspirin 81 mg once.  11. Amiodarone 200 mg once a day.   PAST SURGICAL HISTORY:  1. Bilateral mastectomy.  2. Hysterectomy.   3. Appendectomy.  4. Rotator cuff tear.  5. Stent placement.   SOCIAL HISTORY: No history of smoking, drinking alcohol or using illicit drugs. Lives by herself. Independent of ADLs. Ambulates with a cane.    FAMILY HISTORY: Son died of colon cancer. Father had some unknown cancer. Grandson with diabetes mellitus and heart disease.   REVIEW OF SYSTEMS: The patient is extremely hard of hearing. Unable to obtain review of systems. Even though the patient answers, it would not be accurate.   PHYSICAL EXAMINATION:  GENERAL: This is a well-built, well-nourished, age-appropriate, pleasant female lying down in the bed, not in distress.  VITAL SIGNS: Temperature 97.6, pulse 62, blood pressure 143/71, respiratory rate of 18, oxygen saturation is 92% on room air.  HEENT: Head normocephalic, atraumatic. There is no scleral icterus. Conjunctivae normal. Pupils equal and react to light. Extraocular movements are intact. Mucous membranes moist. No pharyngeal erythema.  NECK: Supple. No lymphadenopathy. No JVD. No carotid bruit.  CHEST: Has no focal tenderness.  LUNGS: Bilaterally clear to auscultation.  HEART: S1, S2 regular. No murmurs are heard. No pedal edema. Pulses 2+.  ABDOMEN: Bowel sounds present. Soft, nontender, nondistended. No hepatosplenomegaly.  SKIN: No rash or lesions.  MUSCULOSKELETAL: Good range of motion in all of the extremities.  NEUROLOGIC: The patient is alert, oriented to place, person and time. No apparent cranial nerve abnormalities. Motor 5/5 in upper and lower extremities. No sensory deficits.   LABORATORIES: BMP: BUN 29, creatinine of 1.46. The  rest of all of the values are within normal limits. CBC: WBC of 6.4, hemoglobin 11.1, platelet count of 226. Cardiac enzymes are within normal limits. Coag profile is PT 13.8, INR of 1.1, PTT 33.5.   ASSESSMENT AND PLAN: Ms. Heindl is a 81 year old female. Had multiple episodes of bright red blood per rectum.  1. Lower  gastrointestinal bleed. Differential diagnosis is diverticular versus arteriovenous malformation versus ischemic colitis. Admit the patient to the stepdown unit concerning about the patient's multiple episodes. Continue to follow up the CBC. Consult gastroenterology. The patient could not tell if the patient had any recent colonoscopy. The patient does not have any abdominal pain. This makes ischemic colitis unlikely. Will hold the aspirin and Plavix.  2. Hypertension: Currently well controlled.  3. Atrial fibrillation: Rate is well controlled. The patient is not on any blood thinners.  4. History of coronary artery disease: Will hold the aspirin and Plavix for now. Continue with the beta blocker.  5. Keep the patient on deep vein thrombosis prophylaxis with sequential compression devices.   TIME SPENT: 50 minutes.   ____________________________ Monica Becton, MD pv:gb D: 01/07/2014 03:52:00 ET T: 01/07/2014 05:54:21 ET JOB#: [id number]  cc: Monica Becton, MD, <Dictator> Dionisio David, MD Grier Mitts Ram Haugan MD ELECTRONICALLY SIGNED 01/30/2014 1:22

## 2015-03-21 NOTE — Consult Note (Signed)
I came to see the patient and she was in nuclear med getting a scan.  Since her age is 28 her relatives would not want a colonoscopy so if there is bleeding she may need angiography or support with transfusions if needed.  Although I normally do not scope 81 year old patients it is possible if bleeding does not stop and angio is not an option.  Electronic Signatures: Manya Silvas (MD)  (Signed on 11-Feb-15 13:52)  Authored  Last Updated: 11-Feb-15 13:52 by Manya Silvas (MD)

## 2015-03-21 NOTE — Consult Note (Signed)
Pt with continued episodic bleeding.  diverticular versus neoplasm most likely sources.  Family did not want colonoscopy/surgery but given persistent bleeding they may change their minds.  Suggest transfusion if below 7 (hgb).  Electronic Signatures: Manya Silvas (MD)  (Signed on 12-Feb-15 18:13)  Authored  Last Updated: 12-Feb-15 18:13 by Manya Silvas (MD)

## 2015-03-21 NOTE — Consult Note (Signed)
I agree with your plans and will sign off please reconsult if I can be of service  Electronic Signatures: Manya Silvas (MD)  (Signed on 13-Feb-15 18:38)  Authored  Last Updated: 13-Feb-15 18:38 by Manya Silvas (MD)

## 2015-03-21 NOTE — Consult Note (Signed)
Pt admitted for LGI bleed, awake alert and oriented.  Last hgb mid 10, conjunctiva look pale, BP last recorded reading 88/44.  Not passed blood since admitted to CCU.  Will get stat hgb due to fall in BP and paleness.  Never had colonoscoopy, son died of colon cancer.  Electronic Signatures: Manya Silvas (MD)  (Signed on 10-Feb-15 12:47)  Authored  Last Updated: 10-Feb-15 12:47 by Manya Silvas (MD)

## 2015-03-21 NOTE — Consult Note (Signed)
Pt scan neg for active bleeding.  Electronic Signatures: Manya Silvas (MD)  (Signed on 11-Feb-15 14:24)  Authored  Last Updated: 11-Feb-15 14:24 by Manya Silvas (MD)

## 2015-03-21 NOTE — Discharge Summary (Signed)
PATIENT NAME:  Kristen Mcdonald, Kristen Mcdonald MR#:  [id number] DATE OF BIRTH:  [birth year]  DATE OF ADMISSION:  01/06/2014 DATE OF DISCHARGE:  01/15/2014  DISCHARGE DIAGNOSES: 1.  Acute blood loss anemia secondary to bright red blood per rectum.  2.  Indeterminate troponin due to demand ischemia.  3.  Acute on chronic diastolic congestive heart failure, mild.  4.  Coronary artery disease.  5.  Hypothyroidism.  6.  General weakness.  7.  Acute respiratory failure.  8.  Hyperlipidemia.   CONSULTS: Dr. Vira Agar with GI   IMAGING STUDIES DONE: Include a GI bleeding scan which was negative for any bleeding.  Chest x-ray showed emphysema.   ADMITTING HISTORY AND PHYSICAL: Please see detailed H and P dictated by Dr. Lunette Stands. In brief, a 81 year old female patient with history of congestive heart failure, CAD, hyperlipidemia, hypertension, presented to the hospital with bright red blood per rectum. The patient was admitted to the hospitalist service.   HOSPITAL COURSE: 1.  Acute blood loss anemia secondary to rectal bleeding. The patient had her hemoglobin drop slowly hospital stay, as low as 7.7, after which she was given a unit of packed RBCs. By the day of discharge, the patient's bleeding has stopped. I have discussed at length with the patient and her healthcare power of attorney, Charlotte Sanes, who is a nurse herself. I have advised that patient would not be a candidate for any procedures or surgeries at this advanced age and, even if we diagnosed her with conditions for surgery, she would not be an ideal candidate for surgery. They do agree with the plan. Presently, watchful observation needs to be done for any bleeding, which seems to have resolved. Her bleeding was likely diverticular but I did explain that this could be any of the causes including possible colon cancer. Patient's aspirin and Plavix are being held at this time. The patient will follow up with her primary care physician. A baby aspirin can  be started if she does not have any further bleeding.  2.  Acute respiratory failure. The patient is needing oxygen. She tends to desat at times and will have sats of 86% on room air. She did have mild worsening congestive heart failure for which she has been given Lasix after which a chest x-ray was done and there was no fluid but emphysematous changes. The patient will continue 1 to 2 liters oxygen after discharge.  3.  Elevated troponin, mild, secondary to demand ischemia.   The patient worked with PT in the hospital, who recommended SNF secondary to the patient's significant weakness and social worker is looking into placement for the patient and will be transferred once a bed is available.   Today, on examination, the patient's neurological status shows motor strength 5 out of 5. No abdominal tenderness.   DISCHARGE MEDICATIONS: Include:  1.  Ranitidine 150 mg oral 2 times a day.  2.  Multivitamin 1 tablet daily.  3.  Lasix 40 mg oral once a day.  4.  Calcium/vitamin D 500/200 international units oral 2 times a day.  5.  Amiodarone 200 mg oral once a day.  6.  Atorvastatin 10 mg oral once a day.  7.  Potassium chloride 10 mEq oral once a day.  8.  Levothyroxine 75 mcg oral once a day.  9.  VESIcare 5 mg daily.  10.  Ferrous sulfate 325 mg oral 2 times a day.   Aspirin and Plavix have been held.   DISCHARGE INSTRUCTIONS: Home oxygen  at 2 liters. Follow up with primary care physician in 1 to 2 weeks. Watch for any bleeding and return to the Emergency Room if this occurs.   TIME SPENT ON DAY OF DISCHARGE IN DISCHARGE ACTIVITY: Was 40 minutes.    ____________________________ Leia Alf Cheronda Erck, MD srs:cs D: 01/15/2014 14:02:00 ET T: 01/15/2014 14:20:50 ET JOB#: [id number]  cc: Alveta Heimlich R. Joelyn Lover, MD, <Dictator> Neita Carp MD ELECTRONICALLY SIGNED 01/16/2014 14:14

## 2015-03-22 NOTE — Consult Note (Signed)
PATIENT NAME:  Kristen Mcdonald, Kristen Mcdonald MR#:  [id number] DATE OF BIRTH:  1913-04-16  DATE OF CONSULTATION:  03/05/2012  REFERRING PHYSICIAN:  Skip Estimable, MD  CONSULTING PHYSICIAN:  Merla Riches, PA-C  REASON FOR CONSULTATION: Cardiac clearance for right hip surgery.   HISTORY OF PRESENT ILLNESS: Kristen Mcdonald is a pleasant 81 year old female who is extremely hard of hearing. The patient is well known to our office as we have followed her for multiple cardiac complaints including coronary artery disease, congestive heart failure, and atrial fibrillation. The patient had a fall without any injury to her head and was noted to have a displaced right hip fracture. She will need upcoming surgery and needs cardiac clearance. The patient currently denies any chest pain, chest pressure, chest heaviness, pain in the jaw or left arm. She has chronic shortness of breath on minimal exertion. She states that she has a lot of pain in her right hip status post her fall.   PAST MEDICAL HISTORY:  1. History of atrial fibrillation.  2. Coronary artery disease status post PTCA to the LAD.  3. Congestive heart failure.  4. Mitral regurgitation.  5. Essential hypertension.  6. Edema.  7. History of emphysematous changes on CT scan with evidence of scarring.  8. Hyperlipidemia.  9. Obesity.  10. Cardiac catheterization 01/11/2008 with no significant stent restenosis of the proximal LAD (previous Cypher stent placed), mid LAD lesion of 50%.  11. Glaucoma. 12. Chronic kidney disease.   PAST SURGICAL HISTORY:  1. Torn rotator cuff.  2. Bilateral mastectomy.  3. Appendectomy.  4. Hysterectomy.   ALLERGIES: No known drug allergies.   HOSPITAL MEDICATIONS (PER OUR OFFICE LIST): 1. Amiodarone 200 mg p.o. daily.  2. Aspirin 81 mg p.o. daily.  3. Calcium Plus D 600/200 one tablet p.o. daily.  4. Clonazepam 0.5 mg 1 tablet at bedtime.  5. Diovan 80 mg 1 tablet p.o. daily.  6. Furosemide 40 mg p.o. daily.   7. Hydralazine 25 mg p.o. twice daily.  8. Klor-Con 10 mEq 1 tablet daily.  9. Levothyroxine 75 mcg daily.  10. Lipitor 20 mg 1 tablet p.o. at bedtime.  11. Nitrostat 0.4 mg sublingually as needed.  12. Plavix 75 mg 1 tablet p.o. daily.  13. Ranitidine 150 mg p.o. twice daily.  14. Tramadol 50 mg as directed.   SOCIAL HISTORY: The patient's son is present in the room with her today. She is a former smoker. Not using alcohol or illicit drugs.   FAMILY HISTORY: Family history of cancer, ischemic heart disease, hypertension, arthritis.   REVIEW OF SYSTEMS: The patient complains of right hip pain, dyspnea and cough.   PHYSICAL EXAMINATION:   GENERAL: This is a pleasant elderly lady who looks younger than her stated age. She is extremely hard of hearing.   VITAL SIGNS: Temperature 98.6 degrees Fahrenheit, heart rate 58, respiratory rate 18, blood pressure 110/63, oxygen saturation 96% on room air.   HEENT: Head atraumatic, normocephalic.   EYES: Pupils round, equal bilaterally, reactive to light. There is no scleral icterus. Conjunctivae are pale, pink. The patient wears glasses.   EARS AND NOSE: Normal to external inspection.   MOUTH: The patient has dentures. Moist mucous membranes.   NECK: Supple. Trachea is midline.   LUNGS: Clear to auscultation anteriorly with no adventitious breath sounds appreciated.   CARDIOVASCULAR: Regular rate and rhythm. There is a murmur noted over the apex, systolic that is grade II out of VI.   ABDOMEN: Nondistended. Bowel  sounds are present in all four quadrants.   ABDOMEN: Soft and nontender to palpation with no hepatosplenomegaly noted.   EXTREMITIES: The patient's right leg/hip has been immobilized.   ANCILLARY DATA: Glucose 155, BUN 21, creatinine 1.62, sodium 137, potassium 3.5, chloride 102, CO2 27, estimated GFR 31, total protein 6.5, albumin 3.5, total protein 0.4, alkaline phosphatase 87, AST 22, ALT 20, white blood cell count 6.1,  hemoglobin 9.4, hematocrit 29, platelet count 203,000. PT 12.3. INR 0.9.   Chest x-ray slight increase in conspicuity of interstitial opacity, may be related to technique.   X-ray of the pelvis, AP only, displaced right femoral neck fracture.   Right femoral x-ray displaced right femoral neck fracture.   EKG normal sinus rhythm, 75 beats per minute, artifact noted, low voltage with poor R wave progression, nonspecific ST changes.   ASSESSMENT/PLAN:  1. Right femoral neck fracture, displaced. Plan is for hemiarthroplasty later today.  2. Coronary artery disease. The patient has a history of cardiac stenting. She is chest pain free at this time and there are no acute ST-T wave changes. Her last stress test on 12/28/2010 with no evidence of ischemia and normal left ventricular ejection fraction. Last echocardiogram was borderline LVF of 50%. She is at low cardiac risk for right hemiarthroplasty. Continue to hold Plavix. If possible would prefer she take aspirin 81 mg daily.  3. We will continue to follow this patient with you.   Thank you very much for allowing [place] to participate in this patient's care. The case was discussed with Dr. Neoma Laming who agrees with the assessment and management as mentioned above.   ____________________________ Merla Riches, PA-C mam:drc D: 03/05/2012 08:36:18 ET T: 03/05/2012 11:58:53 ET JOB#: [id number]  cc: Merla Riches, PA-C, <Dictator> Nariya Neumeyer A Moriah Loughry PA ELECTRONICALLY SIGNED 03/06/2012 8:32

## 2015-03-22 NOTE — Discharge Summary (Signed)
PATIENT NAME:  Kristen, Mcdonald MR#:  [id number] DATE OF BIRTH:  [birth year]  DATE OF ADMISSION:  03/04/2012 DATE OF DISCHARGE:  03/12/2012  ADMITTING DIAGNOSIS: Right hip femoral neck fracture.  DISCHARGE DIAGNOSES:  1. Right hip femoral neck fracture status post hemiarthroplasty.  2. Nausea.  3. Anemia.  4. Shock, improved.  5. Acute respiratory failure, resolved. 6. Urinary tract infection.  7. Paroxysmal atrial fibrillation.   ATTENDING: Skip Estimable, M.D. - [facility].   CONSULTING PHYSICIANS:  1. Fransisco Beau, MD. 2. Cherre Huger, MD. 3. [place], PA-C. 4. Max Sane, MD. 5. Loletha Grayer, MD. [place], MD. [place], MD.   PRIMARY CARE PHYSICIAN: Neoma Laming, MD  PROCEDURES: On 03/05/2012 the patient underwent right hip hemiarthroplasty by Dr. Skip Estimable with assistant Vance Peper, PA-C.   ANESTHESIA: General.   ESTIMATED BLOOD LOSS: 200 mL.  Fluids Replaced: Crystalloid, 1500 mL.   OPERATIVE FINDINGS: Right femoral head.   DRAINS: Hemovac drain was placed.   IMPLANTS: #3 Summit femoral implant (cemented), 11 mm cementralizer, 46 mm OD Cathcart hip ball, -3 mm tapered spacer, and size #3 cement restrictor.  PATIENT HISTORY: The patient is a 81 year old who experienced a mechanical fall on 03/04/2012 with no other injury noted. There was no blood to the head or loss of consciousness. She does note she sprained her left ankle a few days ago, but had been able to ambulate prior to this fall. She presented with right hip pain.   PAST MEDICAL HISTORY/PAST SURGICAL HISTORY:  1. Chronic knee and back pain. 2. Glaucoma. 3. Atrial fibrillation. 4. Coronary artery disease with previous myocardial infarction. 5. Chronic kidney disease, stage V. 6. Hypothyroidism. 7. Congestive heart failure. 8. Anemia. 9. Gastroesophageal reflux disease.  10. Hypercholesterolemia. 11. Hypertension.  12. Previous cardiac stent. 13. Mastectomy.    ALLERGIES AND ADVERSE REACTIONS: None.   PHYSICAL EXAMINATION: HEENT: She has poor hearing. RESPIRATORY: On supplemental oxygen. CARDIAC: Regular rate. RIGHT LOWER EXTREMITY: She has sensation intact. Distal neurovascular components are grossly intact.   X-RAYS: X-ray had revealed a displaced right femoral neck fracture.   HOSPITAL COURSE: The patient was admitted on 03/04/2012 because of the aforementioned fracture. Medicine was consulted. The patient's Plavix and aspirin were held. The patient was deemed high risk for surgery/anesthesia. The patient did undergo hip hemiarthroplasty by Dr. Marry Guan on 51/12/5850 without complication. Cardiology would continue to follow. The patient would be treated with Lovenox, TED hose, and AV-I boots for deep vein thrombosis prophylaxis. She complained of some low back pain and so a heating pad had been ordered for her. In the end, her Lovenox and aspirin would be restarted and we will discontinue her Lovenox. Unfortunately after surgery the patient did have some hypotension and hypoxia. This is possibly due to some postoperative blood loss anemia. She was given a fluid bolus and blood pressure medications were held. Medicine would continue to follow the patient. A Foley catheter was kept in place to help monitor intake and output well. Chest x-ray had shown some improvement, but IV fluids had to be continued. The patient would become more alert thankfully. After transfusion of blood, her hemoglobin would jump up to 8.4. She would continue on nasal cannula oxygen. She was treated for some paroxysmal atrial fibrillation while here. Unfortunately the patient's hemoglobin would drift back down to 7.2, although her blood pressure would improve. She would have to be transfused another unit of packed red blood cells. On 03/10/2012 hemoglobin was 8.2  and on 03/11/2012 hemoglobin was found to be 10. In the end, she was just treated with Tylenol and tramadol for pain. Narcotic  medications were discontinued. Unfortunately she did have some nausea while here. She did pass stool before leaving. Unfortunately she had some incontinence. The patient had worked with physical therapy on multiple occasions and ambulated 25 feet with weight bearing as tolerated, on 03/11/2012. She would continue to struggle with her oxygen saturations lowering at times and fatigue, unfortunately. On 03/11/2012 urinalysis was taken which did show 6 white blood cells, 1+ bacteria, and 1+ leukocyte esterase. She would be started on Macrobid.   CONDITION AT DISCHARGE: Stable.   DISPOSITION: Radiation protection practitioner.   DISCHARGE MEDICATIONS:  1. Macrobid 100 mg per oral twice a day with meals, stop in 10 days from 03/12/2012. 2. Zofran 4 mg every six hours as needed for nausea. 3. Clopidogrel 75 mg oral daily. 4. Aspirin 81 mg oral daily. 5. Albuterol/ipratropium SVN 3 mL every four hours while awake as needed for wheezing. 6. Latanoprost 0.005% ophthalmologic drops one drop in both eyes at bedtime.  7. Ultram 50 mg per oral every four hours as needed for pain.  8. Ranitidine 150 mg oral every 12 hours.  9. Lipitor 20 mg oral daily. 10. Lasix 40 mg oral daily, currently on hold, do not give this. 11. Amiodarone 200 mg oral daily, currently on hold, do not give this.  12. Clonazepam 0.25 mg oral at bedtime is currently on hold, do not give this.  13. Synthroid 0.075 mg per oral every morning.  14. Antacid DS suspension 30 mL every six hours as needed for heartburn.  15. Soapsuds or Fleet's enema as needed. 16. Dulcolax 10 mg rectal daily as needed for constipation.  17. Milk of Magnesia 30 mL oral twice daily as needed for constipation.  18. Senokot-S 1 tablet oral twice a day.  19. Tylenol 500 to 1000 mg every six hours as needed for pain or fever. 20. Calcium carbonate 500 mg/vitamin D 400 international units twice a day with meals.  21. Potassium 10 mEq daily.   DISCHARGE INSTRUCTIONS AND  FOLLOW-UP:  1. Pastoral care consult.  2. Universal skin precautions. 3. Place exit alarm.  4. Abduction pillow between legs when in bed. Posterior hip precautions are in place. The patient is not to flex her hip more than 70 degrees and not to significantly adduct it or internally rotate it.  5. TED hose knee-high are to be worn. 6. Physical therapy and occupational therapy consult. She is weight-bearing as tolerated on the surgical leg.  7. Regular diet, also Ensure. 8. K pad or heating pad as needed to lower back. 9. Nasal cannula oxygen, 2 liters, is to be used. 10. The patient should have a follow-up appointment with Vance Peper, PA-C at [facility] on 04/24/2012. Please call and schedule this.  11. Remove hip staples and apply Steri-Strips on 03/19/2012. Call our office for any sign of infection.  12. Encourage incentive spirometry. ____________________________ Jerrel Ivory Wyatt Galvan, [place] jrp:slb D: 03/12/2012 14:40:40 ET     T: 03/12/2012 15:23:44 ET        JOB#: [id number] cc: Jerrel Ivory. Charlett Nose, [place], <Dictator> [place] PA ELECTRONICALLY SIGNED 03/16/2012 12:42

## 2015-03-22 NOTE — Consult Note (Signed)
Brief Consult Note: Diagnosis: 1. R hip fx, pt with CAD.CHF, h/o afib needing cardiac clearance.   Consult note dictated.   Recommend to proceed with surgery or procedure.   Comments: Patient CP free and EKG with no acute changes. Nuclear stress test 12/28/10 with no evidence of ischemia and normal LVEF and last echo in 2012 with LVEF 50%, mild to moderate LVH. She is at low cardiac risk for R hip surgery. Hold Plavix, but would advise continuation of ASA 81mg /day.  Electronic Signatures: Angelica Ran (MD)   (Signed 08-Apr-13 12:20)  Co-Signer: Brief Consult Note Gabriel Rung A (PA-C)   (Signed 08-Apr-13 08:27)  Authored: Brief Consult Note  Last Updated: 08-Apr-13 12:20 by Angelica Ran (MD)

## 2015-03-22 NOTE — H&P (Signed)
Subjective/Chief Complaint right hip pain    History of Present Illness patient experienced mechanical fall today with no other noted injury.  no blow to the head and no LOC.  she does note she sprained her left ankle a few days ago but had been able to ambulate prior to this fall.   Past Med/Surgical Hx:  Glaucoma:   Knee Pain, Chronic:   Back Pain, Chronic:   Atrial Fibrillation:   CAD:   Hypothyroidism:   CHF:   CKD-Stage V:   Anemia:   MI - Myocardial Infarct:   CAD:   gerd:   Hypercholesterolemia:   Hypertension:   Stent, Cardiac:   Mastectomy:   ALLERGIES:  No Known Allergies:   Family and Social History:   Family History Cancer    Social History negative tobacco, negative ETOH   Review of Systems:   Subjective/Chief Complaint right hip pain    Fever/Chills No    Cough No    Sputum No    Abdominal Pain No    Diarrhea No    Constipation No    Nausea/Vomiting No    SOB/DOE No    Chest Pain No    Dysuria No    Medications/Allergies Reviewed Medications/Allergies reviewed   Physical Exam:   GEN no acute distress    HEENT poor hearing    NECK supple    RESP on supplemental Oxygen    CARD regular rate    ABD denies tenderness    GU no superpubic tenderness    LYMPH - groin    SKIN No rashes    NEURO motor/sensory function intact    PSYCH alert    Additional Comments Right LE sensation intact EHL/FHL/TA/GS function intact warm and well perfused   Routine Hem:  07-Apr-13 18:22    WBC (CBC) 6.1   RBC (CBC) 3.21   Hemoglobin (CBC) 9.6   Hematocrit (CBC) 29.0   Platelet Count (CBC) 203   MCV 90   MCH 30.0   MCHC 33.2   RDW 14.9  Routine Chem:  07-Apr-13 18:22    Glucose, Serum 99   BUN 24   Creatinine (comp) 1.89   Sodium, Serum 140   Potassium, Serum 3.6   Chloride, Serum 103   CO2, Serum 26   Calcium (Total), Serum 8.6  Hepatic:  07-Apr-13 18:22    Bilirubin, Total 0.4   Alkaline Phosphatase 87   SGPT (ALT)  20   SGOT (AST) 22   Total Protein, Serum 6.5   Albumin, Serum 3.5  Routine Chem:  07-Apr-13 18:22    Osmolality (calc) 283   eGFR (African American) 32   eGFR (Non-African American) 26   Anion Gap 11  Routine UA:  07-Apr-13 18:22    Color (UA) Yellow   Clarity (UA) Hazy   Glucose (UA) Negative   Bilirubin (UA) Negative   Ketones (UA) Negative   Specific Gravity (UA) 1.004   Blood (UA) Negative   pH (UA) 5.0   Protein (UA) Negative   Nitrite (UA) Negative   Leukocyte Esterase (UA) Trace   RBC (UA) 1 /HPF   WBC (UA) 4 /HPF   Epithelial Cells (UA) 1 /HPF   WBC Clump (UA) PRESENT   Mucous (UA) PRESENT   Hyaline Cast (UA) 13 /LPF  Routine BB:  07-Apr-13 18:22    Antibody Screen NEGATIVE  Routine Coag:  07-Apr-13 18:22    Prothrombin 12.3   INR 0.9     Assessment/Admission  Diagnosis AP pelvis with Right femoral neck fracture, displaced    Plan admit to ortho medicine consult for clearance and co-management NWB traction NPO p MN plan for hemiarthroplasty tomorrow pending input from cardiology   Electronic Signatures: Margaret Pyle (MD)  (Signed 07-Apr-13 23:51)  Authored: CHIEF COMPLAINT and HISTORY, PAST MEDICAL/SURGIAL HISTORY, ALLERGIES, FAMILY AND SOCIAL HISTORY, REVIEW OF SYSTEMS, PHYSICAL EXAM, LABS, ASSESSMENT AND PLAN   Last Updated: 07-Apr-13 23:51 by Margaret Pyle (MD)

## 2015-03-22 NOTE — Op Note (Signed)
PATIENT NAME:  Kristen Mcdonald, PUCCINI MR#:  [id number] DATE OF BIRTH:  30-Oct-1913  DATE OF PROCEDURE:  03/05/2012  PREOPERATIVE DIAGNOSIS: Displaced right femoral neck fracture.   POSTOPERATIVE DIAGNOSIS:  Displaced right femoral neck fracture.  PROCEDURE PERFORMED: Right hip hemiarthroplasty.   SURGEON: Laurice Record. Holley Bouche., MD   ASSISTANT: Vance Peper, PA-C (required to maintain retraction throughout the procedure).  ANESTHESIA: General  ESTIMATED BLOOD LOSS: 200 mL.   FLUIDS REPLACED: 1500 mL of crystalloids.   DRAINS: Two medium Hemovac.   IMPLANTS UTILIZED: DePuy size 3 Summit femoral stem (cemented), 11 mm Cementralizer, 46 mm outer diameter Cathcart hip ball, a -3 mm tapered spacer, and a size 3 cement restrictor.   INDICATIONS FOR SURGERY: The patient is a 81 year old female who sustained a mechanical fall and landed on her right hip and side. X-rays demonstrated a displaced right femoral neck fracture. After a discussion of the risks and benefits of surgical intervention with the patient and her family, they expressed her understanding of the risks and benefits and agreed with plans for surgical intervention.   PROCEDURE IN DETAIL: The patient was brought to the Operating Room, and after adequate general anesthesia was achieved, the patient was placed in left lateral decubitus position. An axillary roll was placed and all bony prominences were well padded. The right hip and leg were cleaned and prepped with alcohol and DuraPrep and draped in the usual sterile fashion. A "timeout" was performed as per usual protocol. A lateral curvilinear incision was made gently curving towards the posterior superior iliac spine. The IT band was incised in line with the skin incision and the fibers of the gluteus maximus were split in line. The piriformis tendon was identified, skeletonized, and incised at its insertion on the proximal femur and reflected posteriorly. In a similar fashion, short external  rotators were incised and reflected posteriorly. A T-type posterior capsulotomy was performed. A large hemarthrosis was evacuated. The femoral head was removed using a corkscrew device. The femoral head was measured using calipers and a ring gauge, and it was felt that a 46 mm diameter was appropriate. The acetabulum was irrigated and inspected for any fracture fragments. Good articular cartilage was appreciated. The femoral neck cut was performed using an oscillating saw. A pilot hole was created and the canal finder and subsequently a tapered reamer was advanced. Serial broaches were inserted up to a size 3 broach. The calcar region was planed accordingly, and trial reduction was performed using a 46 mm Cathcart hip ball with a -3 mm neck length. This allowed for good restoration of the leg lengths and good stability both anteriorly and posteriorly. Trial components were removed. The femoral canal was sized, and it was felt that a size 3 cement restrictor was appropriate. A size 3 cement restrictor was inserted to the appropriate depth. The proximal canal was then irrigated with copious amounts of normal saline with antibiotic solution using pulsatile lavage and then suctioned dry. The canal was then packed with vaginal packing soaked in dilute Neo-Synephrine. Polymethylmethacrylate cement was prepared in the usual fashion using a vacuum mixer. Vaginal packing was removed and the canal again irrigated and suctioned dry. Cement was introduced in a retrograde fashion and then pressurized. A size 3 Summit femoral stem with an 11 mm distal Cementralizer was positioned and impacted into place. Excess cement was removed using freer elevators. After adequate curing of the cement, the acetabulum was again inspected for any residual bony fragments or debris and then suctioned  dry. The Morse taper was cleaned and dried. A 46 mm outer diameter Cathcart hip ball with a -3 mm tapered spacer was placed on the trunnion and  impacted into place. The hip was then reduced and placed through a range of motion. Excellent stability was appreciated both anteriorly and posteriorly, and good restoration of the limb lengths was appreciated.   The wound was irrigated with copious amounts of normal saline with antibiotic solution using pulsatile lavage and then suctioned dry. A T-type posterior capsulotomy was repaired using #5 Ethibond. The piriformis tendon was reapproximated on the undersurface of the gluteus medius tendon using #5 Ethibond. Two medium drains were placed in the wound bed and brought out through a separate stab incision to be attached to a Hemovac reservoir. The IT band was repaired using interrupted sutures of #1 Vicryl. The subcutaneous tissue was approximated in layers using first #0 Vicryl, followed by 2-0 Vicryl. Skin was closed with skin staples. A sterile dressing was applied. The patient tolerated the procedure well. She was transported to the recovery room in stable condition.  ____________________________ Laurice Record. Holley Bouche., MD jph:cbb D: 03/06/2012 96:04:54 ET T: 03/06/2012 10:06:02 ET JOB#: [id number] JAMES P Holley Bouche MD ELECTRONICALLY SIGNED 03/06/2012 20:58

## 2015-03-22 NOTE — Consult Note (Signed)
PATIENT NAME:  Kristen Mcdonald, Kristen Mcdonald MR#:  [id number] DATE OF BIRTH:  [birth year]  DATE OF CONSULTATION:  03/04/2012  REFERRING PHYSICIAN:  Dr. Criss Rosales  CONSULTING PHYSICIAN:  Cherre Huger, MD  PRIMARY CARE PHYSICIAN: [place] FOR CONSULTATION: Preop medical clearance and management of medical problems.   HISTORY OF PRESENT ILLNESS: Patient is a 81 year old female with past medical history of congestive heart failure, possibly diastolic, coronary artery disease status post PCI, hypertension, hyperlipidemia, hypothyroidism who was bending down today to pick up her dog when she slipped and landed on her right hip. She developed pain and was found to have a right hip fracture. She also recently had a left ankle sprain from a fall a week ago. She has fallen three times in the last week. She last saw Dr. Humphrey Rolls a couple of months ago. She normally walks around with a cane. She is able to do most of her activities of daily living including her grocery shopping. She does get easily tired and has shortness of breath if she does excessive exertion.   ALLERGIES: No known drug allergies.   PAST MEDICAL HISTORY:  1. History of paroxysmal atrial fibrillation. Patient was last admitted to [facility] in September 2011 for atrial fibrillation with RVR. She underwent cardioversion at that time and as per the patient has been in sinus rhythm and is on p.o. amiodarone. 2. History of congestive heart failure, possibly diastolic, last echo is from October 2011 which showed moderate TR, trace PR, ejection fraction of 50% to 55%, dilated RV and mild AS.  3. History of breast cancer status post bilateral mastectomy. 4. Hypothyroidism. 5. Hyperlipidemia. 6. Hypertension. 7. Coronary artery disease, status post PCI in March 2011, LAD 10% stenosis, mid LAD 50% stenosis, RCA 30% stenosis.  8. History of MR. 9. Obesity. 10. Arthritis. 11. Chronic edema.   PAST SURGICAL HISTORY:  1. Bilateral  mastectomy. 2. Hysterectomy.  3. Appendectomy.  4. Rotator cuff repair. 5. Stent placement.   CURRENT MEDICATIONS:  1. Klonopin 0.25 mg at bedtime.  2. Calcium/vitamin D 600/400 once a day. 3. Aspirin 81 mg daily.  4. Synthroid 75 mcg daily.  5. Lasix 40 mg daily.  6. KCl 10 mEq daily.  7. Hydralazine 25 mg b.i.d.  8. Amiodarone 200 mg daily. 9. Lipitor 20 mg daily.  10. Zantac 150 mg b.i.d.  11. Diovan 40 mg daily.  12. Plavix 75 mg daily.   SOCIAL HISTORY: There is no history of smoking, alcohol or drug abuse. Patient lives with her son. She ambulates with a cane. Is able to do most of her activities of daily living.   FAMILY HISTORY: Father had some kind of unknown cancer. Yolanda Bonine has diabetes and heart disease. No history of cerebrovascular accident in the family.   REVIEW OF SYSTEMS: CONSTITUTIONAL: Patient denies any fever, fatigue or weakness. EYES: Denies any blurred or double vision. ENT: Patient is hard of hearing. Denies any tinnitus, ear pain. RESPIRATORY: Denies any cough, wheezing. CARDIOVASCULAR: Denies any chest pain, palpitations. GASTROINTESTINAL: Denies any nausea, vomiting, diarrhea, abdominal pain. GENITOURINARY: Denies any discharge, hematuria. ENDO: Denies any polyuria, nocturia. Has hypothyroidism. HEME/LYMPH: Denies any anemia, easy bruisability. INTEGUMENT: Denies any acne, rash. MUSCULOSKELETAL: Reports pain in her right hip. Denies any swelling, gout. NEUROLOGICAL: Denies any numbness, weakness. PSYCH: Denies any anxiety or depression.    PHYSICAL EXAMINATION:  VITAL SIGNS: Temperature 97.5, heart rate 60, respiratory rate 28, blood pressure 151/67, pulse oximetry 95% on room air.   GENERAL: Patient  is an elderly obese Caucasian female lying in bed. She is in moderate distress from pain.   HEAD: Atraumatic, normocephalic.   EYES: There is pallor. No icterus or cyanosis. Pupils are equal, round, and reactive to light and accommodation. Extraocular  movements are intact.    ENT: Wet mucous membranes. No oropharyngeal erythema or thrush.   NECK: Supple. No masses. No JVD. No thyromegaly or lymphadenopathy.   CHEST WALL: No tenderness to palpation. Not using accessory muscles of respiration. No intercostal muscle retractions.   LUNGS: Bilaterally clear to auscultation. No wheezing, rales or rhonchi.   CARDIOVASCULAR: S1, S2 irregularly irregular. There is a systolic murmur. No rubs or gallops.   ABDOMEN: Soft, nontender, nondistended. No guarding. No rigidity. No organomegaly. Normal bowel sounds.   SKIN: No rashes or lesions.   PERIPHERIES: No pedal edema. 1+ pedal pulses.   MUSCULOSKELETAL: No cyanosis or clubbing.   NEUROLOGIC: Awake, alert, oriented x3. Nonfocal neurological exam. Cranial nerves grossly intact.   PSYCH: Normal mood and affect.   LABORATORY, DIAGNOSTIC AND RADIOLOGICAL DATA: White count 6.1, hemoglobin 9.6, hematocrit 29, platelets 203, glucose 99, BUN 24, creatinine 1.9; rest of complete metabolic panel is normal. Urinalysis shows no evidence of infection. PT-INR normal.   ASSESSMENT AND PLAN: 81 year old female with past medical history of coronary artery disease, congestive heart failure, atrial fibrillation presents with right hip fracture after a mechanical fall. Medicine consulted for preoperative medical clearance and medical management. 1. Preop medical clearance. Patient has history of possible diastolic congestive heart failure, coronary artery disease with MI and stent in the past, history of atrial fibrillation status post cardioversion and she is elderly. She is at very high risk for surgery and anesthesia. Recommended cardiology evaluation by patient's primary cardiologist, Dr. Humphrey Rolls, prior to surgery. Recommend holding aspirin and Plavix for the time being.  2. Anemia, possibly of chronic disease. Patient's hemoglobin is 9.6, in January it was 10.7. Should monitor carefully. Patient may need  transfusion if there is a further drop in her hemoglobin.  3. Chronic kidney disease. Patient's creatinine in January was 2.50, in the past it has been around 1.47 to 1.59. Monitor closely. Avoid nephrotoxic medications. Monitor strict ins and outs.  4. History of hypertension. Appears to be well controlled at present. Will continue patient's current medications. 5. Hyperlipidemia. Continue Lipitor.  6. Hypothyroidism. Continue Synthroid.  7. History of congestive heart failure, possibly diastolic. Continue Lasix.  8. History of paroxysmal atrial fibrillation. Continue amiodarone. Hold aspirin and Plavix for the time being.   Reviewed old medical records, discussed with the patient and her family the plan of care and management.   TIME SPENT: 75 minutes.   ____________________________ Cherre Huger, MD sp:cms D: 03/04/2012 20:58:31 ET T: 03/05/2012 10:59:26 ET  JOB#: [id number] cc: Cherre Huger, MD, <Dictator> [facility] MD ELECTRONICALLY SIGNED 03/05/2012 12:38

## 2015-03-29 NOTE — H&P (Signed)
PATIENT NAME:  Kristen Mcdonald, Kristen Mcdonald MR#:  [id number] DATE OF BIRTH:  10-22-1913  DATE OF ADMISSION:  02/21/2015  CHIEF COMPLAINT: Abdominal pain.   HISTORY OF PRESENT ILLNESS: This is a patient with 3 days of abdominal pain without flatus and very small hard bowel movements. She has had nausea and small emesis. She has never had an episode like this before. She has had frequent problems with constipation. A workup suggested possible small bowel obstruction versus obstipation and an ileus. Of note, she had a GI bleed in the recent past, which require hospitalization.   PAST MEDICAL HISTORY: GI bleed, coronary artery disease, hypertension, and breast cancer.   PAST SURGICAL HISTORY: Bilateral mastectomies, hysterectomy, appendectomy.   MEDICATIONS: Multiple, see reconciliation.   ALLERGIES: None.   FAMILY HISTORY: No history of colon cancer.   SOCIAL HISTORY: The patient does not smoke or drink. She is accompanied by her niece.   REVIEW OF SYSTEMS: An attempt at a complete system review was performed with the aid of the patient's niece. This was not possible due to the patient is both hard of hearing and somewhat confused status. Of note though, however, in the review of systems no unusual findings were noted with the exception of that mentioned in the HPI.   PHYSICAL EXAMINATION:  GENERAL: Elderly female patient obviously hard of hearing.  VITAL SIGNS: At the time in the Emergency Room showed a temperature of 98.4, pulse of 65, respirations 23, blood pleasure 170/79. Her pain scale at 3.  HEENT: Showed no scleral icterus.  NECK: No palpable neck nodes.  CHEST: Clear to auscultation.  CARDIAC: Regular rate and rhythm.  ABDOMEN: Distended, tympanitic. Minimally tender with no rebound or guarding. No peritoneal signs.  EXTREMITIES: Show minimal to moderate edema.  NEUROLOGIC: Grossly intact.  INTEGUMENT: Shows no jaundice.   DIAGNOSTIC DATA: CT scan and KUB are personally reviewed,  suggestive of obstipation with ileus versus early partial small bowel obstruction.   LABORATORY VALUES: Demonstrate normal white blood cell count an hemoglobin and hematocrit of 11 and 34, platelet count of 205,00. Normal electrolytes. Creatinine of 1.2.   ASSESSMENT AND PLAN: This is a patient with versus partial small bowel obstruction. The need for nasogastric intubation and hydration were discussed. She has also had no bowel movements to speak of; therefore, Fleet enemas will be ordered and followup KUB will be ordered well. This was all discussed with she and her niece. They understood and agreed to proceed.   ____________________________ Jerrol Banana Burt Knack, MD rec:bm D: 02/22/2015 00:52:59 ET T: 02/22/2015 01:31:23 ET JOB#: [id number]  cc: Jerrol Banana. Burt Knack, MD, <Dictator> Florene Glen MD ELECTRONICALLY SIGNED 02/22/2015 6:56

## 2015-03-29 NOTE — Discharge Summary (Signed)
PATIENT NAME:  Kristen Mcdonald, SCHOU MR#:  [id number] DATE OF BIRTH:  [birth year]  DATE OF ADMISSION:  02/21/2015 DATE OF DISCHARGE:  03/02/2015   DIAGNOSES: Severe constipation and obstipation, possible partial small bowel obstruction resolved, urinary tract infection, hard of hearing, history of gastrointestinal bleed in the past, coronary artery disease, hypertension, breast cancer.   PROCEDURES: None.   CONSULTANTS:  Dr. Humphrey Rolls, cardiology.   HISTORY OF PRESENT ILLNESS AND HOSPITAL COURSE:  This is a patient who was admitted from the Emergency Room with a history of abdominal pain and distention and poor to absent bowel movements for several days. She was evaluated and treated as if she had a partial small bowel obstruction, although it was not clear that she truly had a partial small bowel obstruction. It was decided that medical management may be of value in this patient. She was admitted to the hospital, hydrated, her nausea was controlled. She was given suppositories and ultimately was treated for a urinary tract infection with ampicillin and MiraLax was given as well. She started having bowel movements and her pain is completely resolved at this point.  She is tolerating a regular diet. Her abdomen is soft, nondistended, and nontender. She is having stools and flatus, and will be discharged in stable condition on her regular medications. Her only new medication will be the MiraLax as well as ampicillin 250 q. 4 x10 days.   She lives alone, but has family members, one of which apparently lives with her, her son, and multiple family members nearby. None were present today for discussion, but I did discuss with social services and discharge coordinator the need for outpatient PT evaluation and a home health nurse to come by and assist with medications to avoid constipation in the future. This was all reviewed and she is discharged in stable condition to follow up with her primary care physician Lourena Simmonds.   ____________________________ Jerrol Banana. Burt Knack, MD rec:sp D: 03/02/2015 12:27:35 ET T: 03/02/2015 12:49:12 ET JOB#: [id number]  cc: Jerrol Banana. Burt Knack, MD, <Dictator> Florene Glen MD ELECTRONICALLY SIGNED 03/02/2015 18:03

## 2015-03-29 NOTE — H&P (Signed)
Subjective/Chief Complaint abd pain   History of Present Illness three days abd pain, no flatus, very small BM, nausea, emesis (small) no prior episode has had GI bleed in past   Past History PMH GI bleed, CAD, HTN, breast cancer PSH bilat mastectomies, hyst, appy   Past Medical Health Coronary Artery Disease, Cancer   Past Med/Surgical Hx:  Glaucoma:   Knee Pain, Chronic:   Back Pain, Chronic:   Atrial Fibrillation:   CAD:   Hypothyroidism:   CHF:   CKD-Stage V:   Anemia:   MI - Myocardial Infarct:   CAD:   gerd:   Hypercholesterolemia:   Hypertension:   Stent, Cardiac:   Mastectomy:   ALLERGIES:  No Known Allergies:   Family and Social History:  Family History Non-Contributory   Social History negative tobacco, negative ETOH   Review of Systems:  Fever/Chills No   Cough No   Abdominal Pain Yes   Diarrhea No   Constipation Yes   Nausea/Vomiting Yes   SOB/DOE No   Chest Pain No   Dysuria No   Tolerating Diet No  Nauseated  Vomiting   ROS Pt not able to provide ROS  HOH, ROS from niece   Physical Exam:  GEN no acute distress, hard of hearing   HEENT pink conjunctivae   NECK No masses   RESP normal resp effort  clear BS   CARD regular rate   ABD denies tenderness  distended  tympanoitic, nontender   LYMPH negative neck   EXTR positive edema   SKIN normal to palpation, ecchymosis on arms   PSYCH alert, A+O to time, place, person   Lab Results: Routine Chem:  25-Mar-16 15:08   Glucose, Serum  109 (65-99 NOTE: New Reference Range  02/03/15)  BUN  27 (6-20 NOTE: New Reference Range  02/03/15)  Creatinine (comp)  1.21 (0.44-1.00 NOTE: New Reference Range  02/03/15)  Sodium, Serum 138 (135-145 NOTE: New Reference Range  02/03/15)  Potassium, Serum 4.0 (3.5-5.1 NOTE: New Reference Range  02/03/15)  Chloride, Serum 104 (101-111 NOTE: New Reference Range  02/03/15)  CO2, Serum 28 (22-32 NOTE: New Reference Range  02/03/15)  Calcium (Total), Serum 8.9 (8.9-10.3 NOTE: New Reference Range  02/03/15)  Anion Gap  6  eGFR (African American)  43  eGFR (Non-African American)  37 (eGFR values <43m/min/1.73 m2 may be an indication of chronic kidney disease (CKD). Calculated eGFR is useful in patients with stable renal function. The eGFR calculation will not be reliable in acutely ill patients when serum creatinine is changing rapidly. It is not useful in patients on dialysis. The eGFR calculation may not be applicable to patients at the low and high extremes of body sizes, pregnant women, and vegetarians.)  B-Type Natriuretic Peptide ([facility]  189 (0-99 NOTE: New Reference Range:  02/03/15)  Cardiac:  25-Mar-16 15:08   Troponin I <0.03 (0.00-0.03 0.03 ng/mL or less: NEGATIVE  Repeat testing in 3-6 hrs  if clinically indicated. >0.05 ng/mL: POTENTIAL  MYOCARDIAL INJURY. Repeat  testing in 3-6 hrs if  clinically indicated. NOTE: An increase or decrease  of 30% or more on serial  testing suggests a  clinically important change NOTE: New Reference Range  02/03/15)    21:10   Troponin I <0.03 (0.00-0.03 0.03 ng/mL or less: NEGATIVE  Repeat testing in 3-6 hrs  if clinically indicated. >0.05 ng/mL: POTENTIAL  MYOCARDIAL INJURY. Repeat  testing in 3-6 hrs if  clinically indicated. NOTE: An increase or decrease  of 30% or more on serial  testing suggests a  clinically important change NOTE: New Reference Range  02/03/15)  Routine UA:  25-Mar-16 22:07   Color (UA) Yellow  Clarity (UA) Hazy  Glucose (UA) Negative  Bilirubin (UA) Negative  Ketones (UA) Negative  Specific Gravity (UA) 1.009  Blood (UA) 1+  pH (UA) 5.0  Protein (UA) Negative  Nitrite (UA) Negative  Leukocyte Esterase (UA) 2+ (Result(s) reported on 20 Feb 2015 at 10:36PM.)  RBC (UA) NONE SEEN  WBC (UA) 11 /HPF  Bacteria (UA) 2+  Epithelial Cells (UA) NONE SEEN  Mucous (UA) PRESENT  Hyaline Cast (UA) 2 /LPF (Result(s)  reported on 20 Feb 2015 at 10:36PM.)  Routine Hem:  25-Mar-16 15:08   WBC (CBC) 7.2  RBC (CBC)  3.77  Hemoglobin (CBC)  11.0  Hematocrit (CBC)  34.1  Platelet Count (CBC) 205 (Result(s) reported on 20 Feb 2015 at 03:31PM.)  MCV 91  MCH 29.2  MCHC 32.3  RDW 14.4   Radiology Results: XRay:    25-Mar-16 22:30, Abdomen Flat and Erect  Abdomen Flat and Erect  REASON FOR EXAM:    abd pain  COMMENTS:       PROCEDURE: DXR - DXR ABDOMEN 2 V FLAT AND ERECT  - Feb 20 2015 10:30PM     CLINICAL DATA:  Periumbilical pain beginning 3-4 days ago.    EXAM:  ABDOMEN - 2 VIEW    COMPARISON:  None.    FINDINGS:  Moderate to large stool burden throughout the colon. Mildly  prominent mid abdominal small bowel loops. Scattered air-fluid  levels on decubitus view. No free air organomegaly. Vascular  calcifications throughout the pelvis. Prior right hip replacement.  Degenerative changes and scoliosis in the lumbar spine.     IMPRESSION:  Mildly prominent mid abdominal small bowel loops with scattered  air-fluid levels. Cannot exclude partial small bowel obstruction.    Moderate to large stool burden in the colon.      Electronically Signed    By: Rolm Baptise M.D.    On: 02/20/2015 22:46     Verified By: Raelyn Number, M.D.,  CT:    26-Mar-16 00:40, CT Abdomen and Pelvis Without Contrast  CT Abdomen and Pelvis Without Contrast  REASON FOR EXAM:    (1) abd pain, air fluid levels; (2) abd pain, air   fluid levels  COMMENTS:       PROCEDURE: CT  - CT ABDOMEN AND PELVIS W0  - Feb 21 2015 12:40AM     CLINICAL DATA:  Abdominal pain radiating to back.    EXAM:  CT ABDOMEN AND PELVIS WITHOUT CONTRAST    TECHNIQUE:  Multidetector CT imaging of the abdomen and pelvis was performed  following the standard protocol without IV contrast.  COMPARISON:  None.    FINDINGS:  Process small hiatal hernia. Heart is normal size. Dependent  ground-glass opacities in the lungs, likely  atelectasis. No  effusions. Coronary artery calcifications and stents noted. Oral  contrast material noted within the esophagus, possibly related to  dysmotility or reflux.    Liver, gallbladder, spleen, pancreas, left adrenal gland, kidneys  have an unremarkable unenhanced appearance. Small low-density nodule  in the right adrenal gland compatible with adenoma.    There is a small amount of free fluid adjacent to the liver and in  the right paracolic gutter. Mildly dilated, predominantly  fluid-filled small bowel loops in the abdomen and pelvis. Distal  small bowel loops are decompressed.  Exact transition is not  visualized, but findings compatible with partial small bowel  obstruction. Sigmoid diverticulosis. No active diverticulitis. Large  stool burden throughout the colon.    Aorta and iliac vessels are heavily calcified, non aneurysmal. No  free air or adenopathy.    No acute bony abnormality or focal bone lesion. Degenerative changes  throughout the lumbar spine. Prior right hip replacement.     IMPRESSION:  Dilated, fluid-filled small bowel loops with distal small bowel  loops decompressed. Findings compatible with partial small bowel  obstruction. Exact transition point not visualized.    Small amount of free fluid in the abdomen and pelvis.    Sigmoid diverticulosis.    Small hiatal hernia.      Electronically Signed    By: Rolm Baptise M.D.    On: 02/21/2015 01:10         Verified By: Raelyn Number, M.D.,    Assessment/Admission Diagnosis SBO vs obstipation IV hydration NG, reexamine treat obstipation   Electronic Signatures: Florene Glen (MD)  (Signed 26-Mar-16 03:23)  Authored: CHIEF COMPLAINT and HISTORY, PAST MEDICAL/SURGIAL HISTORY, ALLERGIES, FAMILY AND SOCIAL HISTORY, REVIEW OF SYSTEMS, PHYSICAL EXAM, LABS, Radiology, ASSESSMENT AND PLAN   Last Updated: 26-Mar-16 03:23 by Florene Glen (MD)

## 2015-03-29 NOTE — Discharge Summary (Signed)
PATIENT NAME:  LEEN, TWOREK MR#:  [id number] DATE OF BIRTH:  May 07, 1913  DATE OF ADMISSION:  03/04/2015 DATE OF DISCHARGE:  03/14/2015  DIAGNOSES:  1. Small bowel obstruction.  2. History of a lower gastrointestinal bleed.  3. Coronary artery disease.  4. Breast cancer.   CONSULTANTS:  [facility].   PROCEDURES: Exploratory laparotomy, extensive adhesiolysis, and ventral hernia repair.   HISTORY OF PRESENT ILLNESS AND HOSPITAL COURSE:  This is a 81 year old female patient who presented to the Emergency Room with signs of a small bowel obstruction. She was treated conservatively with nasogastric suction, but failed to progress in a positive manner. Therefore she was taken to the operating room where extensive adhesiolysis was performed and a small ventral hernia was repaired, no mesh was utilized. Postoperatively she required nasogastric suction for a prolonged period due to ileus, but ultimately was tolerating a regular diet, was discharged in stable condition to the care of her family to follow up in 10 days. She was given oral analgesics and she was also instructed to follow up with her primary care physician as well.     ____________________________ Jerrol Banana. Burt Knack, MD rec:bu D: 03/19/2015 19:29:23 ET T: 03/19/2015 21:17:57 ET JOB#: [id number]  cc: Jerrol Banana. Burt Knack, MD, <Dictator> Florene Glen MD ELECTRONICALLY SIGNED 03/19/2015 23:14

## 2015-03-29 NOTE — Consult Note (Signed)
PATIENT NAME:  Kristen Mcdonald, Kristen Mcdonald MR#:  [id number] DATE OF BIRTH:  Apr 10, 1913  DATE OF CONSULTATION:  03/04/2015  REFERRING PHYSICIAN:  Micheline Maze, MD CONSULTING PHYSICIAN:  Aaron Mose. Hower, MD  PRIMARY CARE PHYSICIAN: Dionisio David, MD  REASON FOR CONSULTATION: Atrial fibrillation, chronic medical problems, small bowel obstruction, recent admission of the same.   HISTORY OF PRESENT ILLNESS: This is a 81 year old Caucasian female with past medical history of essential hypertension; hyperlipidemia, unspecified; coronary artery disease, status post PCI with stent placement to LAD; hypothyroidism, unspecified, presenting with nausea vomits. She was recently discharged from [facility] on 03/02/2015 with discharge diagnosis of partial small bowel obstruction/severe constipation. Since discharge she has had no bowel movements. Now comes with a 1 day duration of nausea and vomiting multiple bouts of nonbloody, nonbilious emesis which is now reported to be "stool-like" prior to presentation to hospital today with regards to smell. The patient and family also mention abdominal distention. Denies passing any flattened, although this is somewhat questionable. Denies any current abdominal pain. History is somewhat limited.   REVIEW OF SYSTEMS:  CONSTITUTIONAL: Denies fevers, chills. Positive for fatigue, weakness.  EYES: Denies blurred vision, double vision, eye pain.  EARS, NOSE, AND THROAT: Denies tinnitus, ear pain, hearing loss.  RESPIRATORY: Denies cough, wheeze, shortness of breath.  CARDIOVASCULAR: Denies chest pain, palpitations, or edema.  GASTROINTESTINAL: Positive for nausea, vomiting, abdominal distention as described above.  GENITOURINARY: Denies dysuria or hematuria.  ENDOCRINE: Denies nocturia or thyroid problems. HEMATOLOGIC AND LYMPHATIC: Denies easy bruising, bleeding.  SKIN: Denies rash or lesion. MUSCULOSKELETAL: Denies pain in neck, back, shoulder, knees, hips, or  arthritic symptoms.  NEUROLOGIC: Denies paralysis or paresthesias.  PSYCHIATRIC: Denies anxiety or depressive symptoms. Otherwise, a full review of systems performed by me is negative.   PAST MEDICAL HISTORY: Includes essential hypertension; hyperlipidemia, unspecified; coronary artery disease, status post PCI and stent placement to LAD; paroxysmal atrial fibrillation; hypothyroidism, unspecified; chronic kidney disease; gastroesophageal reflux disease.    SOCIAL HISTORY: Currently uses a walker for ambulation. Prior to the original hospitalization, was fully functional for activities of daily living and lived independently.   FAMILY HISTORY: Positive for diabetes as well as colon cancer.   ALLERGIES: No known drug allergies.   HOME MEDICATIONS: Include aspirin 81 mg p.o. daily, amiodarone 200 mg p.o. daily, atorvastatin 10 mg p.o. daily, Lasix 40 mg daily as needed for edema, ranitidine 150 mg p.o. daily, MiraLax 17 grams daily for constipation, potassium 10 mEq daily, levothyroxine 75 mcg daily, VESIcare 5 mg p.o. b.i.d., calcium 500+ vitamin D 1 tablet p.o. daily, multivitamin 1 tablet daily.   PHYSICAL EXAMINATION:  VITAL SIGNS: Temperature 98.6, heart rate 85, respirations 16, blood pressure 149/70, saturating 95% on supplemental oxygen. Weight 64.9 kg, BMI 25.3.  GENERAL: A frail-appearing Caucasian female currently in no acute distress.  HEAD: Normocephalic, atraumatic.  EYES: Pupils equal, round, react to light. Extraocular muscles intact. No scleral icterus.  MOUTH: Markedly dry mucosal membrane. Dentition poor. No abscess noted.  EAR, NOSE, AND THROAT: Clear without exudates. No external lesions.  NECK: Supple. No thyromegaly. No nodules. No JVD.  PULMONARY: Clear to auscultation bilaterally without wheezes, rales, or rhonchi. No use of accessory muscles. Good respiratory effort. CHEST: Nontender to palpation.  CARDIOVASCULAR: S1, S2, regular rate and rhythm. No murmurs, rubs, or  gallops. No edema. Pedal pulses 2+ bilaterally.  GASTROINTESTINAL: Soft, nontender, distended. Hypoactive bowel sounds. No hepatosplenomegaly.  MUSCULOSKELETAL: No swelling, clubbing, or edema. Range of  motion is full in all extremities.  NEUROLOGIC: Cranial nerves II-XII intact. No gross focal neurological deficits. Sensation intact. Reflexes intact.  SKIN: No ulcerations, lesions, rashes, cyanosis. Skin warm, dry. Turgor intact.  PSYCHIATRIC: Mood, affect within normal limits. The patient awake, alert, and oriented x 3. Insight and judgment intact.   LABORATORY DATA: Sodium of 141, potassium 3.6, chloride 100, bicarbonate of 31, BUN 17, creatinine 1.34, glucose of 119, albumin of 3.4, otherwise within normal limits. Troponin 0.04. WBC is 25.3, hemoglobin 11.6, platelets of 306,000. Flat plate of the abdomen performed, which reveals a small bowel obstruction.   ASSESSMENT AND PLAN: A 81 year old Caucasian female with a history of essential hypertension; hyperlipidemia, unspecified; hypothyroidism, unspecified, presenting with nausea and vomiting recently admitted and discharged with partial small bowel obstruction and constipation.  1.  Small bowel obstruction: We will defer care to her primary service of general surgery; however, will likely require nasogastric tube with low continuous suction, if opt for conservative treatment plan.  2.  Elevated troponin in the setting of acute kidney injury: We will place on telemetry. Initiate aspirin and statin therapy. Trend cardiac enzymes x 3.  3.  Acute kidney injury: Likely prerenal in etiology. Provide intravenous fluid hydration. Follow urine output and renal function. Hold diuretics for now.  4.  Hypothyroidism: We will check a TSH and continue with Synthroid.  5.  Hypertension: If able to tolerate p.o., continue with her home medications. Otherwise, we can use p.r.n. hydralazine.  6.  Venous thromboembolism prophylaxis: Deferred to his primary service  of surgery. We will use sequential compression devices for now in case she requires any surgical intervention.   CODE STATUS: The patient is DNR, as confirmed with her and family at bedside.   TIME SPENT: 45 minutes.    ____________________________ Aaron Mose. Hower, MD dkh:bm D: 03/04/2015 22:10:50 ET T: 03/05/2015 00:10:51 ET JOB#: [id number]  cc: Aaron Mose. Hower, MD, <Dictator> DAVID Woodfin Ganja MD ELECTRONICALLY SIGNED 03/05/2015 20:48

## 2015-03-29 NOTE — Consult Note (Signed)
PATIENT NAME:  Kristen Mcdonald, FIEBIG MR#:  [id number] DATE OF BIRTH:  Feb 01, 1913  DATE OF CONSULTATION:  02/26/2015  REFERRING PHYSICIAN:   CONSULTING PHYSICIAN:  Dionisio David, MD  HISTORY OF PRESENT ILLNESS: This is a patient of mine. Apparently her family requested me to see the patient. She is a 81 year old white female with a history of PCI and stenting of the proximal LAD, presented to the Emergency Room with abdominal pain and some chest pain.   SOCIAL HISTORY: Unremarkable.   FAMILY HISTORY: Positive for coronary artery disease.   PAST MEDICAL HISTORY: Has a history of hypertension, breast cancer, coronary artery disease, GI bleed. A CT scan suggested ileus.   PHYSICAL EXAMINATION: GENERAL: She is alert, oriented x 3, in mild distress due to abdominal pain.  VITAL SIGNS: Appear to be stable. Blood pressure 113/74, respirations 20, pulse 73, temperature 98, saturation 99.  NECK: No JVD.  LUNGS: Clear.  HEART: Regular rate and rhythm. Normal S1, S2. No audible murmur.  ABDOMEN: Soft, nontender, positive bowel sounds.  EXTREMITIES: No pedal edema.   EKG shows sinus rhythm in the 60s. No acute changes.   ASSESSMENT AND PLAN: The patient has abdominal pain, a history of atrial fibrillation, coronary artery disease, currently is on antibiotics. Apparently is not on aspirin. Advise adding the patient aspirin once she is able to take p.o. medications. We will follow with you. Thank you very much for the referral.    ____________________________ Dionisio David, MD sak:at D: 02/26/2015 12:32:51 ET T: 02/26/2015 12:45:43 ET JOB#: [id number]  cc: Dionisio David, MD, <Dictator> Dionisio David MD ELECTRONICALLY SIGNED 03/10/2015 13:33

## 2015-03-29 NOTE — H&P (Signed)
PATIENT NAME:  Kristen Mcdonald, Kristen Mcdonald MR#:  [id number] DATE OF BIRTH:  [birth year]  DATE OF ADMISSION:  03/04/2015  CHIEF COMPLAINT: Abdominal pain, nausea, and vomiting.   BRIEF HISTORY: Ms. Shaely Gadberry is a 81 year old woman admitted last week on 02/21/2015 with multiple days of abdominal pain without any bowel movements of note. She was felt to be significantly constipated. She had some nausea and a small amount of vomiting. She presented to the Emergency Room for further evaluation. Plain films and CT suggested possible small bowel obstruction. She was admitted to the hospital, placed on nasogastric suction, which she promptly discontinued herself. She was begun on some cathartics and enemas and her symptoms improved. Her bowel function was markedly better and plain films demonstrated passage of contrast through to her colon. She was discharged April 4 to home. She lives with a niece. She has not moved her bowels since discharge. Last night, almost 36 hours after discharge, she began to vomit, again, complaining of significant left lower quadrant abdominal pain. She did well over the course of the day today but this evening had a massive amount of feculent vomiting. She presented to the Emergency Room for further evaluation. Chief complaints at the present time are abdominal pain and some mild nausea. She has not vomited since she has been in the Emergency Room.    A year ago she was admitted for lower GI bleed felt to be possible diverticulitis. She was not scoped at the time. She has a long history of coronary artery disease, hypertension. She had previous stents placed in her heart. She has history of breast cancer treated with bilateral mastectomies. Her abdominal history is largely unremarkable. She denies any history of hepatitis, yellow jaundice, pancreatitis, gallbladder disease, or diverticulitis other than the episode noted a year ago. She does have a history of hysterectomy and appendectomy.    CURRENT MEDICATIONS: She is on multiple current medications; please review the reconciliation as entered in the chart.   ALLERGIES: She has no medical allergies.   FAMILY HISTORY: Positive for diabetes and hypertension.   SOCIAL HISTORY: The patient does not smoke tobacco or drink alcohol. She lives with a niece and is accompanied by the niece tonight. A large amount of the history is obtained through the niece.   REVIEW OF SYSTEMS: Attempted, but so difficult because of the patient's deafness and her inability to concentrate at the present time. She seems to be very confused.   PHYSICAL EXAMINATION:  GENERAL: She is lying at rest in bed. Nasogastric tube has just been placed. She is mildly short of breath on exertion.  VITAL SIGNS: She is afebrile at 98.6, blood pressure 150/70, heart rate is 82 and regular. Oxygen saturation is 93% on room air.  HEENT: Reveals no scleral icterus. No pupillary abnormalities. No facial deformities. Normal eyes. Normal ears.  LYMPHATIC: Reveals no adenopathy in her axilla or her cervical areas.  CHEST: Clear with no adventitious sounds. She is moving air well with good pulmonary excursion.  CARDIAC: Reveals a 2/6 systolic murmur with an irregular rhythm.  ABDOMEN: Mildly distended with some point tenderness in left lower quadrant. She does have a few bowel sounds, which do not sound obstructive. She has no rebound and no guarding on examination. No hernias are noted. She has well-healed lower midline scar consistent with a hysterectomy.  LOWER EXTREMITIES: Reveal full range of motion, no deformities. Trace distal pulses.  NEUROLOGIC: Large unremarkable with equal and symmetrical function bilaterally and no evidence of  any cranial nerve deficits.  PSYCHIATRIC: Normal affect but mild disorientation due to confusion and medication.  SKIN: Reveals multiple skin lesions, all of which appear benign. No bruises, no abrasions, no malignant appearing lesions.    DIAGNOSTIC DATA: I have independently reviewed her plain films. She does have some mildly distended small bowel with contrast in her distal colon. There does not appear to be any gas in the colon. She still appears to be mildly constipated. Laboratory values reveal a slightly elevated creatinine at 1.3 compared with 0.9 at the time of discharge. She does not appear acidotic. Venous lactic acid was 1.1 and her CO2 is 31. Albumin and liver functions all appear normal. Troponin is slightly elevated. White blood cell count is 25,000, most of it is neutrophils.   ASSESSMENT AND PLAN: In this setting, this may be persistent or recurrent small bowel obstruction versus continued colonic inertia with her symptoms of constipation. I am very concerned about her elevated white blood cell count. I will admit her to the hospital, arrange for internal medicine to assist [place] with her medications the remainder of her workup. We will institute nasogastric decompression and if we can get her creatinine straightened out attempt a CT scan tomorrow. This plan has been discussed with the patient's niece, who is her power of attorney. There are significant concerns about her inability to live independently; however, the niece reports that the patient does not have the resources for assisted living. Because of the persistence or recurrence of this most recent problem, it is possible we will need to consider surgical intervention during this admission. This plan has been discussed with the patient and her power of attorney and they are in agreement.    ____________________________ Micheline Maze, MD rle:bm D: 03/04/2015 22:58:11 ET T: 03/04/2015 23:38:36 ET JOB#: [id number]  cc: Micheline Maze, MD, <Dictator> Dionisio David, MD Rodena Goldmann MD ELECTRONICALLY SIGNED 03/05/2015 20:07

## 2015-03-29 NOTE — Consult Note (Signed)
Brief Consult Note: Diagnosis: sbo, aki, leukocytosis.   Patient was seen by consultant.   Consult note dictated.   Orders entered.   Comments: 89yCF htn, hld, cad, hypothyroid p/w n/v recent dc [facility] 03-02-15 dcdx partial sbo, severe constipation no BM since DC 1d n/v multiple bouts non bloody/non bilious emesis, now "stool like"  regarding smell abd distention, questionable no flatus, denies current abd pain  1. sbo: defer to primary team gen surg, likely ngt lcs 2. elevated trop: tele, cex3 3. aki: ivf 4. hypothyroidism 5. htn 6. vte px: scd DNR [id number].  Electronic Signatures: Pallie Swigert, Aaron Mose (MD)  (Signed 06-Apr-16 22:13)  Authored: Brief Consult Note   Last Updated: 06-Apr-16 22:13 by Lytle Butte (MD)

## 2015-03-29 NOTE — Op Note (Signed)
PATIENT NAME:  Kristen Mcdonald, Kristen Mcdonald MR#:  [id number] DATE OF BIRTH:  07-29-1913  DATE OF PROCEDURE:  03/06/2015  PREOPERATIVE DIAGNOSIS: Partial small bowel obstruction.   POSTOPERATIVE DIAGNOSIS: Partial small bowel obstruction.   PROCEDURE: Exploratory laparotomy and adhesiolysis and ventral hernia repair.   SURGEON: Richard E. Phoebe Perch, MD   ANESTHESIA: General with endotracheal tube.   INDICATIONS: This is a patient with recurrent partial small bowel obstruction. She was hospitalized last week and returned with similar symptoms after a partial resolution. CT scan today and physical exam is consistent with ongoing partial small bowel obstruction. She consented to exploration with possible bowel resection, possible ostomy. This was all reviewed for her, including the risks of bleeding, infection, recurrence, bowel injury, bowel resection, and ostomy. This was all reviewed for her and her great-grandson and they understood and agreed to proceed.   FINDINGS: Single ropy adhesion involving the terminal ileum in the pelvis with complete resolution of the bowel obstruction once this was lysed.   DESCRIPTION OF PROCEDURE: The patient was induced to general anesthesia, given IV antibiotics. VTE prophylaxis was in place. Foley catheter was placed. She was prepped and draped in a sterile fashion. Local anesthetic was infiltrated in the skin and subcutaneous tissues and a midline incision was utilized to open and explore the abdominal cavity. Multiple adhesions were identified. These were taken down at the omentum and then the small bowel was identified and found to be greatly distended. It was tracked down into the pelvis where a collapsed bowel was identified distal to a single ropy adhesion involving a single loop of bowel. This was lysed and the bowel appeared quite viable. The bowel was run from the ligament of Treitz to the ileocecal valve and back again. It was then milked through the area of  relative acute stricture. No chronic stricture was noted and the liquid contents of the small bowel went through this area quite easily. The bowel was completely viable and nonischemic.   Once this was complete and hemostasis was adequate, the (Dictation Anomaly)small bowel  was placed back into the abdominal cavity, a piece of Seprafilm was placed and the wound was closed with running #1 PDS. In tying the final suture, the running PDS broke therefore, it was removed partially and a repeat #1 PDS was placed to completely close the abdomen. Skin staples were placed and a sterile dressing was placed.   The patient tolerated the procedure well. There were no complications. She was taken to the recovery room in stable condition to be admitted for continued care. Of note, there was a small ventral hernia in the periumbilical area (she had a prior paramedian incision as well). This hernia was repaired and reduced with a running #1 PDS.   ____________________________ Jerrol Banana. Burt Knack, MD rec:at D: 03/06/2015 15:42:04 ET T: 03/06/2015 15:49:43 ET JOB#: [id number]  cc: Jerrol Banana. Burt Knack, MD, <Dictator> Florene Glen MD ELECTRONICALLY SIGNED 03/06/2015 19:09

## 2015-11-17 ENCOUNTER — Emergency Department
Admission: EM | Admit: 2015-11-17 | Discharge: 2015-11-17 | Disposition: A | Discharge status: Home / Self Care | Payer: Medicare Other | Attending: Emergency Medicine | Admitting: Emergency Medicine

## 2015-11-17 ENCOUNTER — Emergency Department: Payer: Medicare Other

## 2015-11-17 ENCOUNTER — Encounter: Payer: Self-pay | Admitting: Emergency Medicine

## 2015-11-17 DIAGNOSIS — M25461 Effusion, right knee: Secondary | ICD-10-CM | POA: Insufficient documentation

## 2015-11-17 DIAGNOSIS — Z79899 Other long term (current) drug therapy: Secondary | ICD-10-CM | POA: Diagnosis not present

## 2015-11-17 DIAGNOSIS — Z7982 Long term (current) use of aspirin: Secondary | ICD-10-CM | POA: Diagnosis not present

## 2015-11-17 DIAGNOSIS — M25561 Pain in right knee: Secondary | ICD-10-CM | POA: Diagnosis present

## 2015-11-17 LAB — BASIC METABOLIC PANEL
Anion gap: 7 (ref 5–15)
BUN: 27 mg/dL — AB (ref 6–20)
CO2: 28 mmol/L (ref 22–32)
Calcium: 8.8 mg/dL — ABNORMAL LOW (ref 8.9–10.3)
Chloride: 105 mmol/L (ref 101–111)
Creatinine, Ser: 1.21 mg/dL — ABNORMAL HIGH (ref 0.44–1.00)
GFR calc Af Amer: 41 mL/min — ABNORMAL LOW (ref >60)
GFR, EST NON AFRICAN AMERICAN: 35 mL/min — AB (ref >60)
GLUCOSE: 101 mg/dL — AB (ref 65–99)
Potassium: 3.9 mmol/L (ref 3.5–5.1)
Sodium: 140 mmol/L (ref 135–145)

## 2015-11-17 LAB — SYNOVIAL CELL COUNT + DIFF, W/ CRYSTALS
CRYSTALS FLUID: NONE SEEN
Eosinophils-Synovial: 0 %
LYMPHOCYTES-SYNOVIAL FLD: 8 %
MONOCYTE-MACROPHAGE-SYNOVIAL FLUID: 5 %
NEUTROPHIL, SYNOVIAL: 87 %
OTHER CELLS-SYN: 0
WBC, Synovial: 248 /mm3 — ABNORMAL HIGH (ref 0–200)

## 2015-11-17 LAB — CBC WITH DIFFERENTIAL/PLATELET
Basophils Absolute: 0.1 10*3/uL (ref 0–0.1)
Basophils Relative: 1 %
EOS ABS: 0.2 10*3/uL (ref 0–0.7)
Eosinophils Relative: 3 %
HCT: 31.1 % — ABNORMAL LOW (ref 35.0–47.0)
Hemoglobin: 10 g/dL — ABNORMAL LOW (ref 12.0–16.0)
LYMPHS PCT: 14 %
Lymphs Abs: 0.9 10*3/uL — ABNORMAL LOW (ref 1.0–3.6)
MCH: 29.1 pg (ref 26.0–34.0)
MCHC: 32 g/dL (ref 32.0–36.0)
MCV: 90.9 fL (ref 80.0–100.0)
Monocytes Absolute: 0.4 10*3/uL (ref 0.2–0.9)
Monocytes Relative: 7 %
Neutro Abs: 4.8 10*3/uL (ref 1.4–6.5)
Neutrophils Relative %: 75 %
PLATELETS: 228 10*3/uL (ref 150–440)
RBC: 3.42 MIL/uL — AB (ref 3.80–5.20)
RDW: 14.1 % (ref 11.5–14.5)
WBC: 6.3 10*3/uL (ref 3.6–11.0)

## 2015-11-17 LAB — URIC ACID: URIC ACID, SERUM: 5 mg/dL (ref 2.3–6.6)

## 2015-11-17 MED ORDER — IBUPROFEN 400 MG PO TABS
400.0000 mg | ORAL_TABLET | Freq: Once | ORAL | Status: AC
  Administered 2015-11-17: 400 mg via ORAL

## 2015-11-17 MED ORDER — IBUPROFEN 400 MG PO TABS
ORAL_TABLET | ORAL | Status: AC
  Administered 2015-11-17: 400 mg via ORAL
  Filled 2015-11-17: qty 1

## 2015-11-17 MED ORDER — LIDOCAINE-EPINEPHRINE (PF) 1 %-1:200000 IJ SOLN
INTRAMUSCULAR | Status: AC
  Filled 2015-11-17: qty 30

## 2015-11-17 MED ORDER — ACETAMINOPHEN 500 MG PO TABS
ORAL_TABLET | ORAL | Status: AC
  Administered 2015-11-17: 500 mg via ORAL
  Filled 2015-11-17: qty 1

## 2015-11-17 MED ORDER — ACETAMINOPHEN 500 MG PO TABS
500.0000 mg | ORAL_TABLET | Freq: Once | ORAL | Status: AC
  Administered 2015-11-17: 500 mg via ORAL

## 2015-11-17 NOTE — Discharge Instructions (Signed)
You were evaluated for knee pain and found to have joint swelling called effusion. Synovial fluid was removed here in the emergency department. He may wear Ace wrap as needed for comfort and to prevent accumulation of effusion.  Return to the emergency department for any worsening condition including worsening pain, fever, redness, skin rash, or any other symptoms concerning to you.  We discussed use Tylenol every 4 hours as needed for pain. You may try ibuprofen 200 mg every 6-8 hours as needed for pain not controlled by tylenol.  Stay hydrated as ibuprofen can be rough on the kidneys.   Knee Effusion Knee effusion means that you have extra fluid in your knee. This can cause pain. Your knee may be more difficult to bend and move. HOME CARE  Use crutches as told by your doctor.  Wear a knee brace as told by your doctor.  Apply ice to the swollen area:  Put ice in a plastic bag.  Place a towel between your skin and the bag.  Leave the ice on for 20 minutes, 2-3 times per day.  Keep your knee raised (elevated) when you are sitting or lying down.  Take medicines only as told by your doctor.  Do any rehabilitation or strengthening exercises as told by your doctor.  Rest your knee as told by your doctor. You may start doing your normal activities again when your doctor says it is okay.  Keep all follow-up visits as told by your doctor. This is important. GET HELP IF:   You continue to have pain in your knee. GET HELP RIGHT AWAY IF:  You have increased swelling or redness of your knee.  You have severe pain in your knee.  You have a fever.   This information is not intended to replace advice given to you by your health care provider. Make sure you discuss any questions you have with your health care provider.   Document Released: 12/17/2010 Document Revised: 12/05/2014 Document Reviewed: 06/30/2014 Elsevier Interactive Patient Education 2016 [employer]  Knee  Arthrocentesis Knee arthrocentesis is a procedure to remove fluid from your knee joint. The knee joint is a closed space that is lined by a membrane (synovial membrane). This membrane makes fluid to lubricate your joint, but sometimes this space fills with blood or other fluids. This can cause pain, swelling, and stiffness in your knee. You may have this procedure to remove fluid from a swollen knee or to get a sample of knee fluid for testing. Testing your knee fluid can help your health care provider to determine the cause of your knee pain or swelling. It can also help your health care provider to diagnose diseases such as gout, arthritis, and infections. Removing excess fluid from your knee may also be done to relieve your symptoms. LET [facility] CARE PROVIDER KNOW ABOUT:  Any allergies you have.  All medicines you are taking, including vitamins, herbs, eye drops, creams, and over-the-counter medicines.  Previous problems you or members of your family have had with the use of anesthetics.  Any blood disorders you have.  Previous surgeries you have had.  Any medical conditions you may have. RISKS AND COMPLICATIONS Generally, this is a safe procedure. However, problems may occur, including:  Infection.  Bleeding.  Bruising.  Swelling.  Damage to your knee joint.  An allergic reaction to the numbing medicine. BEFORE THE PROCEDURE  Ask your health care provider about changing or stopping your regular medicines. This is especially important if you  are taking diabetes medicines or blood thinners.  Plan to have someone take you home after the procedure. PROCEDURE  You will sit or lie down in a position for your knee to be treated.  Your knee will be cleaned with a germ-killing solution (antiseptic).  You will be given a medicine that numbs the area (local anesthetic). You may feel some stinging.  After your knee becomes numb, a health care provider will insert a long, thin  needle into the side of your knee joint. You may feel some pressure.  Your health care provider will pull back the syringe on the needle to remove fluid. Your health care provider may press on your knee to help remove the fluid.  At the end of the procedure, the needle will be removed.  A bandage (dressing) will be placed over the puncture site. The procedure may vary among health care providers and hospitals. AFTER THE PROCEDURE  Your blood pressure, heart rate, breathing rate, and blood oxygen level will be monitored often until the medicines you were given have worn off.   This information is not intended to replace advice given to you by your health care provider. Make sure you discuss any questions you have with your health care provider.   Document Released: 02/02/2007 Document Revised: 12/05/2014 Document Reviewed: 09/24/2014 Elsevier Interactive Patient Education [employer].

## 2015-11-17 NOTE — ED Provider Notes (Signed)
[facility] Emergency Department Provider Note   ____________________________________________  Time seen: on arrival I have reviewed the triage vital signs and the triage nursing note.  HISTORY  Chief Complaint Knee Pain   Historian Patient, limited due to poor historian and extremely hard of hearing Ems provides report from family  HPI Kristen Mcdonald is a 81 y.o. female here for evaluation of right knee pain and rle swelling.  Unclear when it started, possibly Friday.  Pain is moderate.  No skin rash. No fever.  No hip pain. No abd pain.  No chest pain.  No reported traumatic injury.  No reported hx of gout.    History reviewed. No pertinent past medical history.hypothyroid, hypertension  There are no active problems to display for this patient.   No past surgical history on file.  Current Outpatient Rx  Name  Route  Sig  Dispense  Refill  . amiodarone (PACERONE) 200 MG tablet   Oral   Take 200 mg by mouth daily.         Marland Kitchen aspirin EC 81 MG tablet   Oral   Take 81 mg by mouth daily.         Marland Kitchen atorvastatin (LIPITOR) 10 MG tablet   Oral   Take 10 mg by mouth daily.         . calcium-vitamin D (OSCAL WITH D) 500-200 MG-UNIT tablet   Oral   Take 1 tablet by mouth 2 (two) times daily.         . furosemide (LASIX) 40 MG tablet   Oral   Take 40 mg by mouth daily. Take one tablet by mouth every day. May take an extra tablet after lunch if ankles are swollen         . levothyroxine (SYNTHROID, LEVOTHROID) 88 MCG tablet   Oral   Take 88 mcg by mouth daily before breakfast.         . Multiple Vitamin (MULTIVITAMIN WITH MINERALS) TABS tablet   Oral   Take 1 tablet by mouth daily.         . potassium chloride (K-DUR,KLOR-CON) 10 MEQ tablet   Oral   Take 10 mEq by mouth daily.         . ranitidine (ZANTAC) 150 MG tablet   Oral   Take 150 mg by mouth 2 (two) times daily.         . solifenacin (VESICARE) 5 MG tablet    Oral   Take 5 mg by mouth daily.           Allergies Review of patient's allergies indicates not on file.  History reviewed. No pertinent family history.  Social History Social History  Substance Use Topics  . Smoking status: None  . Smokeless tobacco: None  . Alcohol Use: None    Review of Systems  Constitutional: Negative for fever. Eyes: Negative for visual changes. ENT: Negative for sore throat. Cardiovascular: Negative for chest pain. Respiratory: Negative for shortness of breath. Gastrointestinal: Negative for abdominal pain, vomiting and diarrhea. Genitourinary: Negative for dysuria. Musculoskeletal: Negative for back pain. Skin: Negative for rash. Neurological: Negative for headache. 10 point Review of Systems otherwise negative ____________________________________________   PHYSICAL EXAM:  VITAL SIGNS: ED Triage Vitals  Enc Vitals Group     BP 11/17/15 1040 113/64 mmHg     Pulse Rate 11/17/15 1040 60     Resp 11/17/15 1040 16     Temp 11/17/15 1040 97.4 F (36.3 C)  Temp Source 11/17/15 1040 Oral     SpO2 11/17/15 1040 100 %     Weight --      Height --      Head Cir --      Peak Flow --      Pain Score --      Pain Loc --      Pain Edu? --      Excl. in [place]? --      Constitutional: Alert and cooperative, very hard of hearing.  Poor historian. Well appearing overall and in no distress. Eyes: Conjunctivae are normal. PERRL. Normal extraocular movements. ENT   Head: Normocephalic and atraumatic.   Nose: No congestion/rhinnorhea.   Mouth/Throat: Mucous membranes are moist.   Neck: No stridor. Cardiovascular/Chest: Normal rate, regular rhythm.  No murmurs, rubs, or gallops. Respiratory: Normal respiratory effort without tachypnea nor retractions. Breath sounds are clear and equal bilaterally. No wheezes/rales/rhonchi. Gastrointestinal: Soft. No distention, no guarding, no rebound. Nontender    Genitourinary/rectal:Deferred Musculoskeletal: rle swelling foot to thigh.  Mild knee effusion palpable, tender. Tender knee with range of motion.  Mild warmth to rle, but no cellulitis, or skin rash or redness. Neurologic:  Normal speech and language. No gross or focal neurologic deficits are appreciated. Skin:  Skin is warm, dry and intact. No rash noted.   ____________________________________________   EKG I, Lisa Roca, MD, the attending physician have personally viewed and interpreted all ECGs.  None ____________________________________________  LABS (pertinent positives/negatives)  Basic metabolic panel significant for BUN 27 creatinine 1.21 otherwise without significant abnormality White blood count 6.3 with no left shift. Hemoglobin 10.0 and platelet count is 228 Uric acid 5.0  ____________________________________________  RADIOLOGY All Xrays were viewed by me. Imaging interpreted by Radiologist.  Right knee xray: IMPRESSION: No acute fracture or subluxation. Degenerative changes as described above. Small joint effusion.    rle u/s: No evidence of deep venous thrombosis __________________________________________  PROCEDURES  Procedure(s) performed: Performed by Reita Cliche M.D. Synovial fluid aspiration. Location right knee.  Medial, anterior approach.  Verbal consent obtained. Skin cleaned with Betadine. Local 1% lidocaine with epinephrine used for local skin anesthesia. 18-gauge needle advanced until synovial fluid obtained. Total 33 cc of transparent yellow liquid removed. Sent for synovial studies.  Critical Care performed: None  ____________________________________________   ED COURSE / ASSESSMENT AND PLAN  CONSULTATIONS: None  Pertinent labs & imaging results that were available during my care of the patient were reviewed by me and considered in my medical decision making (see chart for details).   No evidence of cellulitis to the skin. Mild lower  extremity edema. No respiratory symptoms to suggest acute congestive heart failure. Pain does seem more around the knee joint rather than the calf, however ultrasound was obtained to rule out DVT.  Clinically am not suspicious of acute joint infection, however given that there is a palpable joint effusion, I did discuss with the patient and her power of attorney/decision-maker the option of obtaining joint fluid aspiration in attempt to decreased volume and send for analysis such as crystals.  Verbal consent obtained from patient and healthcare power of attorney after discussion of risk and benefits.  Serosanguineous normal appearing joint fluid aspirated.  Sent for synovial fluid analysis.  Patient care transferred to Dr. Marcelene Butte at shift change 4 pm.  Synovial fluid pending.  I anticipate disposition home. If no unexpected findings on synovial fluid, patient may be discharged with my instructions.  Patient / Family / Caregiver informed of clinical  course, medical decision-making process, and agree with plan.   I discussed return precautions, follow-up instructions, and discharged instructions with patient and/or family.  ___________________________________________   FINAL CLINICAL IMPRESSION(S) / ED DIAGNOSES   Final diagnoses:  Knee effusion, right       Lisa Roca, MD 11/17/15 1600

## 2015-11-17 NOTE — ED Provider Notes (Signed)
-----------------------------------------   8:04 PM on 11/17/2015 -----------------------------------------   Blood pressure 116/68, pulse 62, temperature 97.4 F (36.3 C), temperature source Oral, resp. rate 18, SpO2 96 %.  Assuming care from Dr. Reita Cliche  In short, Kristen Mcdonald is a 81 y.o. female with a chief complaint of Knee Pain .  Refer to the original H&P for additional details.  The current plan of care is to follow the results of her knee aspiration. The white blood cell count was only 243/dL and it was no visible crystals. Culture of the fluid is pending. The patient will be discharged per instructions by Dr. Molinda Bailiff, MD 11/17/15 2005

## 2015-11-17 NOTE — ED Notes (Signed)
Pt denies any needs at this time. Family at bedside.

## 2015-11-17 NOTE — ED Notes (Signed)
Pt to ED via EMS c/o R knee pain and swelling, tender to palpation. Pt's PCP wanted her to be evaluated for DVT. Pt is very hard of hearing and her hearing aids are not working, family/POA is on their way.

## 2015-11-17 NOTE — ED Notes (Signed)
Resumed care from [place]. Pt denies knee pain at this time. Says that when she is lying still it doesn't hurt but hurts to touch. Right knee is swollen and warm to touch.

## 2015-11-18 LAB — MISC LABCORP TEST (SEND OUT): Labcorp test code: 19497

## 2015-11-21 LAB — BODY FLUID CULTURE: CULTURE: NO GROWTH

## 2016-02-04 ENCOUNTER — Inpatient Hospital Stay
Admission: EM | Admit: 2016-02-04 | Discharge: 2016-02-08 | DRG: 190 | Disposition: A | Discharge status: Home / Self Care | Payer: Medicare Other | Attending: Internal Medicine | Admitting: Internal Medicine

## 2016-02-04 ENCOUNTER — Emergency Department: Payer: Medicare Other

## 2016-02-04 ENCOUNTER — Encounter: Payer: Self-pay | Admitting: Emergency Medicine

## 2016-02-04 DIAGNOSIS — N189 Chronic kidney disease, unspecified: Secondary | ICD-10-CM | POA: Diagnosis present

## 2016-02-04 DIAGNOSIS — Z823 Family history of stroke: Secondary | ICD-10-CM | POA: Diagnosis not present

## 2016-02-04 DIAGNOSIS — Z90710 Acquired absence of both cervix and uterus: Secondary | ICD-10-CM

## 2016-02-04 DIAGNOSIS — Z7982 Long term (current) use of aspirin: Secondary | ICD-10-CM

## 2016-02-04 DIAGNOSIS — E785 Hyperlipidemia, unspecified: Secondary | ICD-10-CM | POA: Diagnosis present

## 2016-02-04 DIAGNOSIS — R1012 Left upper quadrant pain: Secondary | ICD-10-CM

## 2016-02-04 DIAGNOSIS — Z9010 Acquired absence of unspecified breast and nipple: Secondary | ICD-10-CM

## 2016-02-04 DIAGNOSIS — I2510 Atherosclerotic heart disease of native coronary artery without angina pectoris: Secondary | ICD-10-CM | POA: Diagnosis present

## 2016-02-04 DIAGNOSIS — I252 Old myocardial infarction: Secondary | ICD-10-CM | POA: Diagnosis not present

## 2016-02-04 DIAGNOSIS — Z955 Presence of coronary angioplasty implant and graft: Secondary | ICD-10-CM

## 2016-02-04 DIAGNOSIS — Z9981 Dependence on supplemental oxygen: Secondary | ICD-10-CM

## 2016-02-04 DIAGNOSIS — J9610 Chronic respiratory failure, unspecified whether with hypoxia or hypercapnia: Secondary | ICD-10-CM | POA: Diagnosis present

## 2016-02-04 DIAGNOSIS — I959 Hypotension, unspecified: Secondary | ICD-10-CM | POA: Diagnosis present

## 2016-02-04 DIAGNOSIS — D649 Anemia, unspecified: Secondary | ICD-10-CM | POA: Diagnosis present

## 2016-02-04 DIAGNOSIS — H9190 Unspecified hearing loss, unspecified ear: Secondary | ICD-10-CM | POA: Diagnosis present

## 2016-02-04 DIAGNOSIS — J440 Chronic obstructive pulmonary disease with acute lower respiratory infection: Secondary | ICD-10-CM | POA: Diagnosis present

## 2016-02-04 DIAGNOSIS — Z809 Family history of malignant neoplasm, unspecified: Secondary | ICD-10-CM

## 2016-02-04 DIAGNOSIS — E039 Hypothyroidism, unspecified: Secondary | ICD-10-CM | POA: Diagnosis present

## 2016-02-04 DIAGNOSIS — R079 Chest pain, unspecified: Secondary | ICD-10-CM

## 2016-02-04 DIAGNOSIS — E860 Dehydration: Secondary | ICD-10-CM | POA: Diagnosis present

## 2016-02-04 DIAGNOSIS — K219 Gastro-esophageal reflux disease without esophagitis: Secondary | ICD-10-CM | POA: Diagnosis present

## 2016-02-04 DIAGNOSIS — I509 Heart failure, unspecified: Secondary | ICD-10-CM | POA: Diagnosis present

## 2016-02-04 DIAGNOSIS — Z9049 Acquired absence of other specified parts of digestive tract: Secondary | ICD-10-CM | POA: Diagnosis not present

## 2016-02-04 DIAGNOSIS — M1990 Unspecified osteoarthritis, unspecified site: Secondary | ICD-10-CM | POA: Diagnosis present

## 2016-02-04 DIAGNOSIS — R0602 Shortness of breath: Secondary | ICD-10-CM

## 2016-02-04 DIAGNOSIS — Z79899 Other long term (current) drug therapy: Secondary | ICD-10-CM | POA: Diagnosis not present

## 2016-02-04 DIAGNOSIS — R531 Weakness: Secondary | ICD-10-CM

## 2016-02-04 DIAGNOSIS — J189 Pneumonia, unspecified organism: Secondary | ICD-10-CM

## 2016-02-04 DIAGNOSIS — N1330 Unspecified hydronephrosis: Secondary | ICD-10-CM | POA: Diagnosis present

## 2016-02-04 HISTORY — DX: Gastro-esophageal reflux disease without esophagitis: K21.9

## 2016-02-04 HISTORY — DX: Anemia, unspecified: D64.9

## 2016-02-04 HISTORY — DX: Heart failure, unspecified: I50.9

## 2016-02-04 HISTORY — DX: Unspecified osteoarthritis, unspecified site: M19.90

## 2016-02-04 HISTORY — DX: Malignant (primary) neoplasm, unspecified: C80.1

## 2016-02-04 HISTORY — DX: Chronic obstructive pulmonary disease, unspecified: J44.9

## 2016-02-04 HISTORY — DX: Disorder of thyroid, unspecified: E07.9

## 2016-02-04 HISTORY — DX: Acute myocardial infarction, unspecified: I21.9

## 2016-02-04 LAB — CBC WITH DIFFERENTIAL/PLATELET
Basophils Absolute: 0 10*3/uL (ref 0–0.1)
Basophils Relative: 0 %
EOS PCT: 0 %
Eosinophils Absolute: 0 10*3/uL (ref 0–0.7)
HCT: 32 % — ABNORMAL LOW (ref 35.0–47.0)
Hemoglobin: 10.6 g/dL — ABNORMAL LOW (ref 12.0–16.0)
LYMPHS ABS: 0.4 10*3/uL — AB (ref 1.0–3.6)
LYMPHS PCT: 3 %
MCH: 29.6 pg (ref 26.0–34.0)
MCHC: 33 g/dL (ref 32.0–36.0)
MCV: 89.7 fL (ref 80.0–100.0)
MONO ABS: 0.9 10*3/uL (ref 0.2–0.9)
Monocytes Relative: 7 %
Neutro Abs: 11.7 10*3/uL — ABNORMAL HIGH (ref 1.4–6.5)
Neutrophils Relative %: 90 %
PLATELETS: 199 10*3/uL (ref 150–440)
RBC: 3.57 MIL/uL — AB (ref 3.80–5.20)
RDW: 14.2 % (ref 11.5–14.5)
WBC: 13 10*3/uL — AB (ref 3.6–11.0)

## 2016-02-04 LAB — URINALYSIS COMPLETE WITH MICROSCOPIC (ARMC ONLY)
BILIRUBIN URINE: NEGATIVE
GLUCOSE, UA: NEGATIVE mg/dL
KETONES UR: NEGATIVE mg/dL
Leukocytes, UA: NEGATIVE
Nitrite: NEGATIVE
Protein, ur: NEGATIVE mg/dL
Specific Gravity, Urine: 1.008 (ref 1.005–1.030)
pH: 5 (ref 5.0–8.0)

## 2016-02-04 LAB — COMPREHENSIVE METABOLIC PANEL
ALT: 14 U/L (ref 14–54)
AST: 18 U/L (ref 15–41)
Albumin: 3.8 g/dL (ref 3.5–5.0)
Alkaline Phosphatase: 69 U/L (ref 38–126)
Anion gap: 8 (ref 5–15)
BUN: 27 mg/dL — ABNORMAL HIGH (ref 6–20)
CALCIUM: 8.9 mg/dL (ref 8.9–10.3)
CHLORIDE: 103 mmol/L (ref 101–111)
CO2: 29 mmol/L (ref 22–32)
CREATININE: 1.12 mg/dL — AB (ref 0.44–1.00)
GFR, EST AFRICAN AMERICAN: 45 mL/min — AB (ref >60)
GFR, EST NON AFRICAN AMERICAN: 39 mL/min — AB (ref >60)
Glucose, Bld: 128 mg/dL — ABNORMAL HIGH (ref 65–99)
Potassium: 3.8 mmol/L (ref 3.5–5.1)
Sodium: 140 mmol/L (ref 135–145)
TOTAL PROTEIN: 6.6 g/dL (ref 6.5–8.1)
Total Bilirubin: 0.4 mg/dL (ref 0.3–1.2)

## 2016-02-04 LAB — INFLUENZA PANEL BY PCR (TYPE A & B)
H1N1FLUPCR: NOT DETECTED
INFLAPCR: NEGATIVE
INFLBPCR: NEGATIVE

## 2016-02-04 LAB — BRAIN NATRIURETIC PEPTIDE: B NATRIURETIC PEPTIDE 5: 224 pg/mL — AB (ref 0.0–100.0)

## 2016-02-04 LAB — RAPID INFLUENZA A&B ANTIGENS (ARMC ONLY): INFLUENZA B (ARMC): NEGATIVE

## 2016-02-04 LAB — LIPASE, BLOOD: LIPASE: 12 U/L (ref 11–51)

## 2016-02-04 LAB — TROPONIN I: Troponin I: <0.03 ng/mL (ref <0.031)

## 2016-02-04 LAB — RAPID INFLUENZA A&B ANTIGENS: Influenza A (ARMC): NEGATIVE

## 2016-02-04 MED ORDER — AZITHROMYCIN 500 MG IV SOLR
500.0000 mg | Freq: Once | INTRAVENOUS | Status: AC
  Administered 2016-02-04: 500 mg via INTRAVENOUS
  Filled 2016-02-04: qty 500

## 2016-02-04 MED ORDER — SODIUM CHLORIDE 0.9% FLUSH
3.0000 mL | Freq: Two times a day (BID) | INTRAVENOUS | Status: DC
  Administered 2016-02-04 – 2016-02-08 (×8): 3 mL via INTRAVENOUS

## 2016-02-04 MED ORDER — DEXTROSE 5 % IV SOLN
1.0000 g | Freq: Once | INTRAVENOUS | Status: AC
  Administered 2016-02-04: 1 g via INTRAVENOUS
  Filled 2016-02-04: qty 10

## 2016-02-04 MED ORDER — LEVOTHYROXINE SODIUM 88 MCG PO TABS
88.0000 ug | ORAL_TABLET | Freq: Every day | ORAL | Status: DC
  Administered 2016-02-05 – 2016-02-08 (×4): 88 ug via ORAL
  Filled 2016-02-04 (×4): qty 1

## 2016-02-04 MED ORDER — IOHEXOL 350 MG/ML SOLN
75.0000 mL | Freq: Once | INTRAVENOUS | Status: AC | PRN
  Administered 2016-02-04: 75 mL via INTRAVENOUS

## 2016-02-04 MED ORDER — SODIUM CHLORIDE 0.9% FLUSH: 3.0000 mL | INTRAVENOUS | Status: DC | PRN

## 2016-02-04 MED ORDER — ASPIRIN EC 81 MG PO TBEC
81.0000 mg | DELAYED_RELEASE_TABLET | Freq: Every day | ORAL | Status: DC
  Administered 2016-02-04 – 2016-02-08 (×5): 81 mg via ORAL
  Filled 2016-02-04 (×5): qty 1

## 2016-02-04 MED ORDER — DARIFENACIN HYDROBROMIDE ER 7.5 MG PO TB24
7.5000 mg | ORAL_TABLET | Freq: Every day | ORAL | Status: DC
  Administered 2016-02-04 – 2016-02-05 (×2): 7.5 mg via ORAL
  Filled 2016-02-04 (×3): qty 1

## 2016-02-04 MED ORDER — DEXTROSE 5 % IV SOLN
500.0000 mg | INTRAVENOUS | Status: DC
  Administered 2016-02-05: 500 mg via INTRAVENOUS
  Filled 2016-02-04: qty 500

## 2016-02-04 MED ORDER — DEXTROSE 5 % IV SOLN
1.0000 g | INTRAVENOUS | Status: DC
  Administered 2016-02-05: 1 g via INTRAVENOUS
  Filled 2016-02-04: qty 10

## 2016-02-04 MED ORDER — GUAIFENESIN 100 MG/5ML PO SOLN: 5.0000 mL | ORAL | Status: DC | PRN

## 2016-02-04 MED ORDER — SODIUM CHLORIDE 0.9 % IV SOLN: 250.0000 mL | INTRAVENOUS | Status: DC | PRN

## 2016-02-04 MED ORDER — FAMOTIDINE 20 MG PO TABS
20.0000 mg | ORAL_TABLET | Freq: Every day | ORAL | Status: DC
  Administered 2016-02-04 – 2016-02-08 (×5): 20 mg via ORAL
  Filled 2016-02-04 (×5): qty 1

## 2016-02-04 MED ORDER — IOHEXOL 350 MG/ML SOLN: 75.0000 mL | Freq: Once | INTRAVENOUS | Status: DC | PRN

## 2016-02-04 MED ORDER — IOHEXOL 240 MG/ML SOLN
25.0000 mL | Freq: Once | INTRAMUSCULAR | Status: AC | PRN
  Administered 2016-02-04: 25 mL via ORAL

## 2016-02-04 MED ORDER — SODIUM CHLORIDE 0.9 % IV SOLN
INTRAVENOUS | Status: DC
  Administered 2016-02-04 – 2016-02-05 (×2): via INTRAVENOUS

## 2016-02-04 MED ORDER — SODIUM CHLORIDE 0.9 % IV BOLUS (SEPSIS)
500.0000 mL | Freq: Once | INTRAVENOUS | Status: AC
  Administered 2016-02-04: 500 mL via INTRAVENOUS

## 2016-02-04 MED ORDER — ENOXAPARIN SODIUM 40 MG/0.4ML ~~LOC~~ SOLN: 40.0000 mg | SUBCUTANEOUS | Status: DC

## 2016-02-04 MED ORDER — ENOXAPARIN SODIUM 30 MG/0.3ML ~~LOC~~ SOLN
30.0000 mg | SUBCUTANEOUS | Status: DC
  Administered 2016-02-05 – 2016-02-07 (×3): 30 mg via SUBCUTANEOUS
  Filled 2016-02-04 (×3): qty 0.3

## 2016-02-04 MED ORDER — ATORVASTATIN CALCIUM 10 MG PO TABS
10.0000 mg | ORAL_TABLET | Freq: Every day | ORAL | Status: DC
Start: 2016-02-04 — End: 2016-02-08
  Administered 2016-02-04 – 2016-02-08 (×5): 10 mg via ORAL
  Filled 2016-02-04 (×5): qty 1

## 2016-02-04 MED ORDER — IPRATROPIUM-ALBUTEROL 0.5-2.5 (3) MG/3ML IN SOLN
3.0000 mL | Freq: Four times a day (QID) | RESPIRATORY_TRACT | Status: DC | PRN
  Filled 2016-02-04: qty 3

## 2016-02-04 MED ORDER — AMIODARONE HCL 200 MG PO TABS
200.0000 mg | ORAL_TABLET | Freq: Every day | ORAL | Status: DC
  Administered 2016-02-04 – 2016-02-08 (×5): 200 mg via ORAL
  Filled 2016-02-04 (×5): qty 1

## 2016-02-04 NOTE — Progress Notes (Signed)
Pt. Became agitated and hitting at staff. Trying to get OOB. Trying to pull IV and foley out. Safety rounder assigned to round on room. Mitts put on pt.

## 2016-02-04 NOTE — ED Provider Notes (Signed)
[facility] Emergency Department Provider Note  ____________________________________________  Time seen: Approximately 11:35 AM  I have reviewed the triage vital signs and the nursing notes.   HISTORY  Chief Complaint Abdominal Pain  Caveat - HPI and ROS limited slightly due to severe hardness of hearing and patient does not have her hearing aids. Information is obtained partly from the patient as well as from her family at bedside.  HPI Kristen Mcdonald is a 82 y.o. female with history of CAD, CHF, HLD, CKD, hypothyroidism, and h/o SBO presenting for evaluation of left-sided abdominal pain and generalized weakness today. Pain has been steady today though she is unable to describe the nature of the pain does report that it was in the left abdomen but then "went around to my heart" and points at the left chest. The patient lives at home. When [employer] came to deliver her food, she was unable to get up to answer the door secondary to weakness. Her med alert button was pushed and EMS arrived to find her at home in bed complaining of left-sided abdominal pain. The patient has also had 3 bowel movements today. She has had nasal congestion. She has a chronic home oxygen requirement.   Past Medical History  Diagnosis Date  . CHF (congestive heart failure) ([place])   . Thyroid disease   . GERD (gastroesophageal reflux disease)     Patient Active Problem List   Diagnosis Date Noted  . Pneumonia 02/04/2016    No past surgical history on file.  Current Outpatient Rx  Name  Route  Sig  Dispense  Refill  . amiodarone (PACERONE) 200 MG tablet   Oral   Take 200 mg by mouth daily.         Marland Kitchen aspirin EC 81 MG tablet   Oral   Take 81 mg by mouth daily.         Marland Kitchen atorvastatin (LIPITOR) 10 MG tablet   Oral   Take 10 mg by mouth daily.         . calcium-vitamin D (OSCAL WITH D) 500-200 MG-UNIT tablet   Oral   Take 1 tablet by mouth 2 (two) times daily.          . furosemide (LASIX) 40 MG tablet   Oral   Take 40 mg by mouth daily. Take one tablet by mouth every day. May take an extra tablet after lunch if ankles are swollen         . levothyroxine (SYNTHROID, LEVOTHROID) 88 MCG tablet   Oral   Take 88 mcg by mouth daily before breakfast.         . Multiple Vitamin (MULTIVITAMIN WITH MINERALS) TABS tablet   Oral   Take 1 tablet by mouth daily.         . potassium chloride (K-DUR,KLOR-CON) 10 MEQ tablet   Oral   Take 10 mEq by mouth daily.         . ranitidine (ZANTAC) 150 MG tablet   Oral   Take 150 mg by mouth 2 (two) times daily.         . solifenacin (VESICARE) 5 MG tablet   Oral   Take 5 mg by mouth daily.           Allergies Review of patient's allergies indicates no known allergies.  No family history on file.  Social History Social History  Substance Use Topics  . Smoking status: Never Smoker   . Smokeless  tobacco: None  . Alcohol Use: No    Review of Systems  Gastrointestinal: + abdominal pain.  + loose stools today  Caveat - HPI and ROS limited slightly due to severe hardness of hearing and patient does not have her hearing aids. Information is obtained partly from the patient as well as from her family at bedside. ____________________________________________   PHYSICAL EXAM:  Filed Vitals:   02/04/16 1129 02/04/16 1137 02/04/16 1334  BP: 137/63    Pulse: 70  63  Temp:  99.1 F (37.3 C)   TempSrc:  Oral   Resp:   23  Weight: 151 lb 3.2 oz (68.584 kg)    SpO2: 98%  98%     Constitutional: Alert and oriented. Nontoxic appearing and in no acute distress. Severely hard of hearing but able to cooperate for the most part with the examination.  Eyes: Conjunctivae are normal. PERRL. EOMI. Head: Atraumatic. Nose: No congestion/rhinnorhea. Mouth/Throat: Mucous membranes are moist.  Oropharynx non-erythematous. Neck: No stridor.  Appears supple without meningismus.  Cardiovascular: Normal  rate, regular rhythm. Grossly normal heart sounds.  Good peripheral circulation. Respiratory: Normal respiratory effort.  No retractions. Lungs CTAB. Gastrointestinal: Soft with diffuse tenderness throughout the abdomen.  No CVA tenderness. Genitourinary:  deferred  Musculoskeletal: No lower extremity tenderness nor edema.  No joint effusions. status post bilateral mastectomies.  Neurologic:  Normal speech and language. No gross focal neurologic deficits are appreciated.  Skin:  Skin is warm, dry and intact. No rash noted. Psychiatric: Mood and affect are normal. Speech and behavior are normal.  ____________________________________________   LABS (all labs ordered are listed, but only abnormal results are displayed)  Labs Reviewed  CBC WITH DIFFERENTIAL/PLATELET - Abnormal; Notable for the following:    WBC 13.0 (*)    RBC 3.57 (*)    Hemoglobin 10.6 (*)    HCT 32.0 (*)    Neutro Abs 11.7 (*)    Lymphs Abs 0.4 (*)    All other components within normal limits  COMPREHENSIVE METABOLIC PANEL - Abnormal; Notable for the following:    Glucose, Bld 128 (*)    BUN 27 (*)    Creatinine, Ser 1.12 (*)    GFR calc non Af Amer 39 (*)    GFR calc Af Amer 45 (*)    All other components within normal limits  URINALYSIS COMPLETEWITH MICROSCOPIC ([facility] ONLY) - Abnormal; Notable for the following:    Color, Urine YELLOW (*)    APPearance HAZY (*)    Hgb urine dipstick 1+ (*)    Bacteria, UA RARE (*)    Squamous Epithelial / LPF 0-5 (*)    All other components within normal limits  BRAIN NATRIURETIC PEPTIDE - Abnormal; Notable for the following:    B Natriuretic Peptide 224.0 (*)    All other components within normal limits  RAPID INFLUENZA A&B ANTIGENS ([facility] ONLY)  CULTURE, BLOOD (ROUTINE X 2)  CULTURE, BLOOD (ROUTINE X 2)  LIPASE, BLOOD  TROPONIN I   ____________________________________________  EKG  ED ECG REPORT I, Joanne Gavel, the attending physician, personally viewed and  interpreted this ECG.   Date: 02/04/2016  EKG Time: 11:41  Rate: 66  Rhythm: normal sinus rhythm  Axis: normal  Intervals:left anterior fascicular block  ST&T Change: No acute ST elevation.  ____________________________________________  RADIOLOGY  CXR IMPRESSION: 1. Cardiomegaly, stable. 2. Prominent interstitial markings bilaterally, most likely representing interstitial edema superimposed on chronic interstitial lung disease.  CTA chest and CT abdomen and  pelvis IMPRESSION: Extensive atherosclerotic disease without aneurysm or dissection.  Small hiatal hernia.  Patchy BILATERAL pulmonary infiltrates with assessment of the lower lobes limited by respiratory motion artifacts.  Distal colonic diverticulosis without evidence of diverticulitis.  Stable RIGHT adrenal mass.  BILATERAL hydronephrosis and hydroureter though this may be in part related to significant bladder distention. ____________________________________________   PROCEDURES  Procedure(s) performed: None  Critical Care performed: No  ____________________________________________   INITIAL IMPRESSION / ASSESSMENT AND PLAN / ED COURSE  Pertinent labs & imaging results that were available during my care of the patient were reviewed by me and considered in my medical decision making (see chart for details).  Kristen Mcdonald is a 82 y.o. female with history of CAD, CHF, HLD, CKD, hypothyroidism, and h/o SBO presenting for evaluation of left-sided abdominal pain and generalized weakness today. On exam, she is nontoxic appearing and in no acute distress. Vital signs are stable, she is afebrile, satting 98% on 4 L of oxygen. She does have tenderness to palpation throughout the entire abdomen, no rebound or guarding. She appears to have a nonfocal neurological examination. Plan for screening labs, chest x-ray, urinalysis, likely CT scan. Reassess for disposition.    ----------------------------------------- 3:48 PM on 02/04/2016 -----------------------------------------  Allergies reviewed and are notable for leukocytosis, mild anemia, mild chronic creatinine elevation at 1.12. Urinalysis with several red blood cells but not consistent with infection. Chest x-ray showed evidence of volume overload/interstitial edema. CT of the abdomen and pelvis is negative. CTA chest shows no acute abnormality associated with the great vessels however bilateral pulmonary infiltrates are visualized which are concerning for pneumonia. On repeat questioning, the patient reports that she has had productive cough for several days. We'll treat with IV ceftriaxone and azithromycin. Will be judicious with IV fluids given history of CHF and risk of precipitating flash pulmonary edema. Case discussed with the hospitalist, Dr. Bridgett Larsson, for admission.   ____________________________________________   FINAL CLINICAL IMPRESSION(S) / ED DIAGNOSES  Final diagnoses:  Left upper quadrant pain  Weakness  Chest pain, unspecified chest pain type  Community acquired pneumonia      Joanne Gavel, MD 02/04/16 1551

## 2016-02-04 NOTE — ED Notes (Signed)
Patient remains in CT Scan.

## 2016-02-04 NOTE — Progress Notes (Signed)
82 y/o F ordered Lovenox 40 mg daily for DVT prophylaxis.   CrCl: 29 ml/min using actual body weight (height unavailable)  Will decrease Lovenox to 30 mg daily.   Ulice Dash, PharmD Clinical Pharmacist

## 2016-02-04 NOTE — Plan of Care (Signed)
Full admission unable to be completed by 1st shift.  Pt extremely HOH and has no aides.  Pt unable to understand and family is unreachable at this time.  Hopefully RN can complete admission once family arrives.

## 2016-02-04 NOTE — H&P (Addendum)
[place] [facility] [place] NAME: Kristen Mcdonald    MR#:  [id number]  DATE OF BIRTH:  Nov 30, 1912  DATE OF ADMISSION:  02/04/2016  PRIMARY CARE PHYSICIAN: Pcp Not In System   REQUESTING/REFERRING PHYSICIAN: Joanne Gavel, MD  CHIEF COMPLAINT:   Chief Complaint  Patient presents with  . Abdominal Pain    HISTORY OF PRESENT ILLNESS:  Kristen Mcdonald  is a 82 y.o. female with a known history of COPD, CHF and heart attack. The patient was sent to ED due to left-sided abdominal pain and generalized weakness today. The patient is very hard hearing, unable to provide any information. According to ED physician, the patient believes at home. Unable to get up to answer the door secondary to weakness. She has chronic home oxygen requirement.  PAST MEDICAL HISTORY:   Past Medical History  Diagnosis Date  . CHF (congestive heart failure) ([place])   . Thyroid disease   . GERD (gastroesophageal reflux disease)   . Anemia   . COPD (chronic obstructive pulmonary disease) ([place])   . Arthritis   . Heart attack ([place])   . Cancer [facility])     PAST SURGICAL HISTORY:   Past Surgical History  Procedure Laterality Date  . Vesicovaginal fistula closure w/ tah    . Mastectomy    . Abdominal hysterectomy    . Appendectomy    . Coronary angioplasty with stent placement      SOCIAL HISTORY:   Social History  Substance Use Topics  . Smoking status: Never Smoker   . Smokeless tobacco: Not on file  . Alcohol Use: No    FAMILY HISTORY:   Family History  Problem Relation Age of Onset  . Stroke Mother   . Cancer Father     DRUG ALLERGIES:  No Known Allergies  REVIEW OF SYSTEMS:  Unable to obtain  MEDICATIONS AT HOME:   Prior to Admission medications   Medication Sig Start Date End Date Taking? Authorizing Provider  amiodarone (PACERONE) 200 MG tablet Take 200 mg by mouth daily.   Yes Historical Provider, MD  aspirin EC 81 MG tablet Take 81 mg by  mouth daily.   Yes Historical Provider, MD  atorvastatin (LIPITOR) 10 MG tablet Take 10 mg by mouth daily.   Yes Historical Provider, MD  calcium-vitamin D (OSCAL WITH D) 500-200 MG-UNIT tablet Take 1 tablet by mouth 2 (two) times daily.   Yes Historical Provider, MD  furosemide (LASIX) 40 MG tablet Take 40 mg by mouth daily. Take one tablet by mouth every day. May take an extra tablet after lunch if ankles are swollen   Yes Historical Provider, MD  levothyroxine (SYNTHROID, LEVOTHROID) 88 MCG tablet Take 88 mcg by mouth daily before breakfast.   Yes Historical Provider, MD  Multiple Vitamin (MULTIVITAMIN WITH MINERALS) TABS tablet Take 1 tablet by mouth daily.   Yes Historical Provider, MD  potassium chloride (K-DUR,KLOR-CON) 10 MEQ tablet Take 10 mEq by mouth daily.   Yes Historical Provider, MD  ranitidine (ZANTAC) 150 MG tablet Take 150 mg by mouth 2 (two) times daily.   Yes Historical Provider, MD  solifenacin (VESICARE) 5 MG tablet Take 5 mg by mouth daily.   Yes Historical Provider, MD      VITAL SIGNS:  Blood pressure 137/63, pulse 63, temperature 99.1 F (37.3 C), temperature source Oral, resp. rate 23, weight 68.584 kg (151 lb 3.2 oz), SpO2 98 %.  PHYSICAL EXAMINATION:  GENERAL:  82 y.o.-year-old patient lying in the bed with no acute distress.  EYES: Pupils equal, round, reactive to light and accommodation. No scleral icterus. Extraocular muscles intact.  HEENT: Head atraumatic, normocephalic. Oropharynx and nasopharynx clear. Very hard of hearing. NECK:  Supple, no jugular venous distention. No thyroid enlargement, no tenderness.  LUNGS: Normal breath sounds bilaterally, no wheezing, rales,rhonchi or crepitation. No use of accessory muscles of respiration.  CARDIOVASCULAR: S1, S2 normal. No murmurs, rubs, or gallops.  ABDOMEN: Soft, nontender, nondistended. Bowel sounds present. No organomegaly or mass.  EXTREMITIES: No pedal edema, cyanosis, or clubbing.  NEUROLOGIC: Cranial  nerves II through XII are intact. Muscle strength 4/5 in all extremities. Sensation intact. Gait not checked.  PSYCHIATRIC: The patient is awake but confused. SKIN: No obvious rash, lesion, or ulcer.   LABORATORY PANEL:   CBC  Recent Labs Lab 02/04/16 1236  WBC 13.0*  HGB 10.6*  HCT 32.0*  PLT 199   ------------------------------------------------------------------------------------------------------------------  Chemistries   Recent Labs Lab 02/04/16 1236  NA 140  K 3.8  CL 103  CO2 29  GLUCOSE 128*  BUN 27*  CREATININE 1.12*  CALCIUM 8.9  AST 18  ALT 14  ALKPHOS 69  BILITOT 0.4   ------------------------------------------------------------------------------------------------------------------  Cardiac Enzymes  Recent Labs Lab 02/04/16 1236  TROPONINI <0.03   ------------------------------------------------------------------------------------------------------------------  RADIOLOGY:  Dg Chest 2 View  02/04/2016  CLINICAL DATA:  Chest pain and tightness this morning. EXAM: CHEST  2 VIEW COMPARISON:  Chest x-rays dated 03/06/2015, 02/20/2015 and 03/04/2012 FINDINGS: Cardiomegaly is stable. Overall cardiomediastinal silhouette appears stable in size and configuration. Atherosclerotic changes again seen at the aortic knob. Prominent interstitial markings again noted bilaterally, slightly increased compared to recent exams, most likely a combination of interstitial edema and fibrosis. No confluent opacity to suggest a developing pneumonia. No pleural effusion seen. No pneumothorax. Degenerative changes again noted within the thoracic spine and at the right shoulder. No acute osseous abnormality. IMPRESSION: 1. Cardiomegaly, stable. 2. Prominent interstitial markings bilaterally, most likely representing interstitial edema superimposed on chronic interstitial lung disease. Electronically Signed   By: Franki Cabot M.D.   On: 02/04/2016 12:19   Ct Abdomen Pelvis W  Contrast  02/04/2016  CLINICAL DATA:  Unable to get out of bed, LEFT abdominal pain, shortness of breath, chest pain, history CHF EXAM: CT ANGIOGRAPHY CHEST CT ABDOMEN AND PELVIS WITH CONTRAST TECHNIQUE: Multidetector CT imaging of the chest was performed using the standard protocol during bolus administration of intravenous contrast. Multiplanar CT image reconstructions and MIPs were obtained to evaluate the vascular anatomy. Multidetector CT imaging of the abdomen and pelvis was performed using the standard protocol during bolus administration of intravenous contrast. CONTRAST:  89mL OMNIPAQUE IOHEXOL 350 MG/ML SOLN IV. Dilute oral contrast. COMPARISON:  CT abdomen and pelvis 03/06/2015 FINDINGS: CTA CHEST FINDINGS Extensive atherosclerotic calcifications aorta, coronary arteries, and proximal great vessels. Suspected coronary stent in LAD. Aorta normal caliber without aneurysm, dissection, or intramural hematoma. Pulmonary arteries appear grossly patent on nondedicated exam, though assessment at the lower lobes is limited by respiratory motion. Small hiatal hernia. Scattered normal size mediastinal lymph nodes without thoracic adenopathy. Patchy BILATERAL pulmonary infiltrates. No pleural effusion or pneumothorax. Scattered AP tracheal narrowing. Few superior endplate compression deformities within thoracic spine. CT ABDOMEN and PELVIS FINDINGS RIGHT adrenal mass 2.0 x 0.9 cm image 20. BILATERAL hydronephrosis and hydroureter extending to a distended urinary bladder. Beam hardening artifacts in pelvis obscure structures including bladder, no gross bladder abnormality seen. Liver, spleen, pancreas, kidneys  and LEFT adrenal gland otherwise normal. Normal sized ovaries with surgical absence of uterus. Sigmoid diverticulosis without evidence of diverticulitis. Stomach and bowel loops otherwise normal appearance. Scattered atherosclerotic calcifications without aneurysm. No mass, adenopathy, free air or free fluid.  Diffuse osseous demineralization with multilevel degenerative disc disease changes lumbar spine. Review of the MIP images confirms the above findings. IMPRESSION: Extensive atherosclerotic disease without aneurysm or dissection. Small hiatal hernia. Patchy BILATERAL pulmonary infiltrates with assessment of the lower lobes limited by respiratory motion artifacts. Distal colonic diverticulosis without evidence of diverticulitis. Stable RIGHT adrenal mass. BILATERAL hydronephrosis and hydroureter though this may be in part related to significant bladder distention. Electronically Signed   By: Lavonia Dana M.D.   On: 02/04/2016 15:34   Ct Angio Chest Aorta W/cm &/or Wo/cm  02/04/2016  CLINICAL DATA:  Unable to get out of bed, LEFT abdominal pain, shortness of breath, chest pain, history CHF EXAM: CT ANGIOGRAPHY CHEST CT ABDOMEN AND PELVIS WITH CONTRAST TECHNIQUE: Multidetector CT imaging of the chest was performed using the standard protocol during bolus administration of intravenous contrast. Multiplanar CT image reconstructions and MIPs were obtained to evaluate the vascular anatomy. Multidetector CT imaging of the abdomen and pelvis was performed using the standard protocol during bolus administration of intravenous contrast. CONTRAST:  52mL OMNIPAQUE IOHEXOL 350 MG/ML SOLN IV. Dilute oral contrast. COMPARISON:  CT abdomen and pelvis 03/06/2015 FINDINGS: CTA CHEST FINDINGS Extensive atherosclerotic calcifications aorta, coronary arteries, and proximal great vessels. Suspected coronary stent in LAD. Aorta normal caliber without aneurysm, dissection, or intramural hematoma. Pulmonary arteries appear grossly patent on nondedicated exam, though assessment at the lower lobes is limited by respiratory motion. Small hiatal hernia. Scattered normal size mediastinal lymph nodes without thoracic adenopathy. Patchy BILATERAL pulmonary infiltrates. No pleural effusion or pneumothorax. Scattered AP tracheal narrowing. Few  superior endplate compression deformities within thoracic spine. CT ABDOMEN and PELVIS FINDINGS RIGHT adrenal mass 2.0 x 0.9 cm image 20. BILATERAL hydronephrosis and hydroureter extending to a distended urinary bladder. Beam hardening artifacts in pelvis obscure structures including bladder, no gross bladder abnormality seen. Liver, spleen, pancreas, kidneys and LEFT adrenal gland otherwise normal. Normal sized ovaries with surgical absence of uterus. Sigmoid diverticulosis without evidence of diverticulitis. Stomach and bowel loops otherwise normal appearance. Scattered atherosclerotic calcifications without aneurysm. No mass, adenopathy, free air or free fluid. Diffuse osseous demineralization with multilevel degenerative disc disease changes lumbar spine. Review of the MIP images confirms the above findings. IMPRESSION: Extensive atherosclerotic disease without aneurysm or dissection. Small hiatal hernia. Patchy BILATERAL pulmonary infiltrates with assessment of the lower lobes limited by respiratory motion artifacts. Distal colonic diverticulosis without evidence of diverticulitis. Stable RIGHT adrenal mass. BILATERAL hydronephrosis and hydroureter though this may be in part related to significant bladder distention. Electronically Signed   By: Lavonia Dana M.D.   On: 02/04/2016 15:34    EKG:   Orders placed or performed during the hospital encounter of 02/04/16  . ED EKG  . ED EKG  . EKG 12-Lead  . EKG 12-Lead    IMPRESSION AND PLAN:   Bilateral pneumonia (CAP) with leukocytosis The patient will be admitted to medical floor. I will continue Zithromax and Rocephin, DuoNeb when necessary and Robitussin when necessary. Follow-up CBC, sputum and blood culture.  Dehydration. Hold Lasix, give gentle rehydration and follow-up BMP. Anemia. Stable.  Hypotension. Patient blood pressure decreased to 87/47, give normal saline bolus.  BILATERAL hydronephrosis and hydroureter due to urine  retention.  All the records are reviewed  and case discussed with ED provider. Management plans discussed with the patient' grandson [facility]) and they are in agreement.  CODE STATUS: Full code per her grandson.  TOTAL TIME TAKING CARE OF THIS PATIENT: 57 minutes.    Demetrios Loll M.D on 02/04/2016 at 4:00 PM  Between 7am to 6pm - Pager - [phone]  After 6pm go to [email or web address] - password [email or web address] [facility]  Office  [phone]  CC: Primary care physician; Pcp Not In System

## 2016-02-04 NOTE — ED Notes (Signed)
EMS CALLED TO [facility] BY [employer] DUE TO PATIENT STATING SHE WAS UNABLE TO GET OUT OF BED TODAY.  PATIENT ARRIVES TO ED C/O LEFT ABDOMINAL PAIN.

## 2016-02-04 NOTE — Progress Notes (Signed)
Pharmacy Antibiotic Note  Kristen Mcdonald is a 82 y.o. female admitted on 02/04/2016 with pneumonia.  Pharmacy has been consulted for azithromycin and ceftriaxone dosing.  Plan: Ceftriaxone 1 g iv q 24 hours.  Azithromycin 500 mg iv q 24 hours.   Weight: 151 lb 3.2 oz (68.584 kg)  Temp (24hrs), Avg:99.1 F (37.3 C), Min:99.1 F (37.3 C), Max:99.1 F (37.3 C)   Recent Labs Lab 02/04/16 1236  WBC 13.0*  CREATININE 1.12*    CrCl cannot be calculated (Unknown ideal weight.).    No Known Allergies  Antimicrobials this admission: Azithromycin  3/9 >>  Ceftriaxone 3/9 >>   Dose adjustments this admission:   Microbiology results: 3/9 BCx: pending Flu negative  Thank you for allowing pharmacy to be a part of this patient's care.  Napoleon Form 02/04/2016 3:53 PM

## 2016-02-05 ENCOUNTER — Inpatient Hospital Stay: Payer: Medicare Other

## 2016-02-05 LAB — CBC WITH DIFFERENTIAL/PLATELET
BASOS PCT: 0 %
Basophils Absolute: 0 10*3/uL (ref 0–0.1)
Eosinophils Absolute: 0.2 10*3/uL (ref 0–0.7)
Eosinophils Relative: 2 %
HEMATOCRIT: 28.9 % — AB (ref 35.0–47.0)
HEMOGLOBIN: 9.7 g/dL — AB (ref 12.0–16.0)
LYMPHS ABS: 1.6 10*3/uL (ref 1.0–3.6)
Lymphocytes Relative: 21 %
MCH: 29.9 pg (ref 26.0–34.0)
MCHC: 33.5 g/dL (ref 32.0–36.0)
MCV: 89.3 fL (ref 80.0–100.0)
MONOS PCT: 12 %
Monocytes Absolute: 0.9 10*3/uL (ref 0.2–0.9)
NEUTROS ABS: 5.1 10*3/uL (ref 1.4–6.5)
NEUTROS PCT: 65 %
Platelets: 200 10*3/uL (ref 150–440)
RBC: 3.23 MIL/uL — ABNORMAL LOW (ref 3.80–5.20)
RDW: 14.3 % (ref 11.5–14.5)
WBC: 7.8 10*3/uL (ref 3.6–11.0)

## 2016-02-05 LAB — BASIC METABOLIC PANEL
Anion gap: 5 (ref 5–15)
BUN: 27 mg/dL — ABNORMAL HIGH (ref 6–20)
CHLORIDE: 107 mmol/L (ref 101–111)
CO2: 29 mmol/L (ref 22–32)
CREATININE: 1.21 mg/dL — AB (ref 0.44–1.00)
Calcium: 8.5 mg/dL — ABNORMAL LOW (ref 8.9–10.3)
GFR calc non Af Amer: 35 mL/min — ABNORMAL LOW (ref >60)
GFR, EST AFRICAN AMERICAN: 41 mL/min — AB (ref >60)
Glucose, Bld: 146 mg/dL — ABNORMAL HIGH (ref 65–99)
Potassium: 3.8 mmol/L (ref 3.5–5.1)
Sodium: 141 mmol/L (ref 135–145)

## 2016-02-05 LAB — HIV ANTIBODY (ROUTINE TESTING W REFLEX): HIV Screen 4th Generation wRfx: NONREACTIVE

## 2016-02-05 LAB — LACTIC ACID, PLASMA: Lactic Acid, Venous: 0.9 mmol/L (ref 0.5–2.0)

## 2016-02-05 LAB — STREP PNEUMONIAE URINARY ANTIGEN: Strep Pneumo Urinary Antigen: NEGATIVE

## 2016-02-05 MED ORDER — VANCOMYCIN HCL IN DEXTROSE 1-5 GM/200ML-% IV SOLN
1000.0000 mg | INTRAVENOUS | Status: DC
  Filled 2016-02-05: qty 200

## 2016-02-05 MED ORDER — VANCOMYCIN HCL IN DEXTROSE 1-5 GM/200ML-% IV SOLN
1000.0000 mg | Freq: Once | INTRAVENOUS | Status: AC
  Administered 2016-02-05: 1000 mg via INTRAVENOUS
  Filled 2016-02-05: qty 200

## 2016-02-05 MED ORDER — PIPERACILLIN-TAZOBACTAM 3.375 G IVPB
3.3750 g | Freq: Three times a day (TID) | INTRAVENOUS | Status: DC
  Administered 2016-02-05 – 2016-02-06 (×2): 3.375 g via INTRAVENOUS
  Filled 2016-02-05 (×4): qty 50

## 2016-02-05 MED ORDER — SODIUM CHLORIDE 0.9 % IV BOLUS (SEPSIS)
500.0000 mL | Freq: Once | INTRAVENOUS | Status: AC
  Administered 2016-02-05: 500 mL via INTRAVENOUS

## 2016-02-05 NOTE — Progress Notes (Signed)
ANTIBIOTIC CONSULT NOTE - INITIAL  Pharmacy Consult for Vancomycin/Zosyn  Indication: pneumonia  No Known Allergies  Patient Measurements: Height: 5\' 2"  (157.5 cm) Weight: 148 lb (67.132 kg) IBW/kg (Calculated) : 50.1 Adjusted Body Weight: 56.9 kg   Vital Signs: Temp: 98 F (36.7 C) (03/10 2005) Temp Source: Oral (03/10 2005) BP: 70/42 mmHg (03/10 2013) Pulse Rate: 52 (03/10 2007) Intake/Output from previous day: 03/09 0701 - 03/10 0700 In: 566.7 [I.V.:566.7] Out: 2350 [Urine:2350] Intake/Output from this shift: Total I/O In: 3 [I.V.:3] Out: 100 [Urine:100]  Labs:  Recent Labs  02/04/16 1236  WBC 13.0*  HGB 10.6*  PLT 199  CREATININE 1.12*   Estimated Creatinine Clearance: 24 mL/min (by C-G formula based on Cr of 1.12). No results for input(s): VANCOTROUGH, VANCOPEAK, VANCORANDOM, GENTTROUGH, GENTPEAK, GENTRANDOM, TOBRATROUGH, TOBRAPEAK, TOBRARND, AMIKACINPEAK, AMIKACINTROU, AMIKACIN in the last 72 hours.   Microbiology: Recent Results (from the past 720 hour(s))  Rapid Influenza A&B Antigens ([place] only)     Status: None   Collection Time: 02/04/16 12:36 PM  Result Value Ref Range Status   Influenza A ([facility]) NEGATIVE NEGATIVE Final   Influenza B ([facility]) NEGATIVE NEGATIVE Final  Blood culture (routine x 2)     Status: None (Preliminary result)   Collection Time: 02/04/16  4:05 PM  Result Value Ref Range Status   Specimen Description BLOOD RIGHT HAND  Final   Special Requests BAA,ANA,AER,5ML  Final   Culture NO GROWTH < 24 HOURS  Final   Report Status PENDING  Incomplete  Blood culture (routine x 2)     Status: None (Preliminary result)   Collection Time: 02/04/16  5:55 PM  Result Value Ref Range Status   Specimen Description BLOOD LEFT ASSIST CONTROL  Final   Special Requests BAA,ANA,AER,5ML  Final   Culture NO GROWTH < 24 HOURS  Final   Report Status PENDING  Incomplete    Medical History: Past Medical History  Diagnosis Date  . CHF (congestive heart  failure) ([place])   . Thyroid disease   . GERD (gastroesophageal reflux disease)   . Anemia   . COPD (chronic obstructive pulmonary disease) ([place])   . Arthritis   . Heart attack ([place])   . Cancer [facility])     Medications:  Prescriptions prior to admission  Medication Sig Dispense Refill Last Dose  . amiodarone (PACERONE) 200 MG tablet Take 200 mg by mouth daily.   02/04/2016 at Unknown time  . aspirin EC 81 MG tablet Take 81 mg by mouth daily.   02/04/2016 at 0700  . atorvastatin (LIPITOR) 10 MG tablet Take 10 mg by mouth daily.   02/04/2016 at Unknown time  . calcium-vitamin D (OSCAL WITH D) 500-200 MG-UNIT tablet Take 1 tablet by mouth 2 (two) times daily.   02/04/2016 at Unknown time  . furosemide (LASIX) 40 MG tablet Take 40 mg by mouth daily. Take one tablet by mouth every day. May take an extra tablet after lunch if ankles are swollen   02/04/2016 at Unknown time  . levothyroxine (SYNTHROID, LEVOTHROID) 88 MCG tablet Take 88 mcg by mouth daily before breakfast.   02/04/2016 at Unknown time  . Multiple Vitamin (MULTIVITAMIN WITH MINERALS) TABS tablet Take 1 tablet by mouth daily.   02/04/2016 at Unknown time  . potassium chloride (K-DUR,KLOR-CON) 10 MEQ tablet Take 10 mEq by mouth daily.   02/04/2016 at Unknown time  . ranitidine (ZANTAC) 150 MG tablet Take 150 mg by mouth 2 (two) times daily.   02/04/2016 at  Unknown time  . solifenacin (VESICARE) 5 MG tablet Take 5 mg by mouth daily.   02/04/2016 at Unknown time   Assessment: CrCl = 24 ml/min Ke = 0.024 hr-1 T1/2 = 28.8 hrs Vd = 47 L   No Pseudomonas risk factors noted.   Goal of Therapy:  Vancomycin trough level 15-20 mcg/ml  Plan:  Expected duration 7 days with resolution of temperature and/or normalization of WBC   Vancomycin 1 gm IV X 1 given on 3/10 @ 23:00. Vancomycin 1 gm IV Q24H ordered to start 3/11 @ 15:00.  This pt will not reach Css until 3/16 @ 23:00. Will draw 1st trough on 3/13 @ 14:40, which will not be at Css.   Kristen Mcdonald  D 02/05/2016,9:43 PM

## 2016-02-05 NOTE — Progress Notes (Signed)
Respiratory at bedside to assess patient. Nursing supervisor Colletta Maryland notified. Xray at bedside

## 2016-02-05 NOTE — Progress Notes (Signed)
Patient agitated and verbally combative. Loss of IV access. Patient refuses lab draw and IV restart. Family called. Reorientation giving with no relief.

## 2016-02-05 NOTE — Care Management (Signed)
Patient was sleeping. PT stating patient would benefit from HHPT. Spoke with patient's grandson Has rollator. PCP Dr. Humphrey Rolls- cardiopulm. Spoke with with patient's grandson who states Joesphine Bare that is also her HCPOA. Names of home health agencies presented to him over the phone. He agrees to home health and would like to use [facility] and feels that patient would agree to that. Referral to [facility]. RNCM will continue to follow.

## 2016-02-05 NOTE — Progress Notes (Signed)
Pharmacy Antibiotic Note  Kristen Mcdonald is a 82 y.o. female admitted on 02/04/2016 with pneumonia.  Pharmacy has been consulted for azithromycin and ceftriaxone dosing.  Plan: Continue: Ceftriaxone 1 g iv q 24 hours.  Azithromycin 500 mg iv q 24 hours.   Height: 5\' 2"  (157.5 cm) Weight: 148 lb (67.132 kg) IBW/kg (Calculated) : 50.1  Temp (24hrs), Avg:98.4 F (36.9 C), Min:97.8 F (36.6 C), Max:98.9 F (37.2 C)   Recent Labs Lab 02/04/16 1236  WBC 13.0*  CREATININE 1.12*    Estimated Creatinine Clearance: 24 mL/min (by C-G formula based on Cr of 1.12).    No Known Allergies  Antimicrobials this admission: Azithromycin  3/9 >>  Ceftriaxone 3/9 >>   Dose adjustments this admission:   Microbiology results: 3/9 BCx: pending Flu negative  Thank you for allowing pharmacy to be a part of this patient's care.  Ramond Dial 02/05/2016 2:58 PM

## 2016-02-05 NOTE — Progress Notes (Signed)
Family at bedside patient cooperative with care. Lab at bedside to draw labs. IV started and bolus infusion. Patient resting between care. No acute distress noted.

## 2016-02-05 NOTE — Evaluation (Signed)
Physical Therapy Evaluation Patient Details Name: Kristen Mcdonald MRN: [id number] DOB: 02-25-1913 Today's Date: 02/05/2016   History of Present Illness  82 yo F presented to ED from home due to L abdominal pain, weakness and SOB found to have pneumonia. PMH includes CHF, thyroid disease, GERD, MI, Cancer, Mastectomy, Coronary angioplasty wiht stent placement.  Clinical Impression  Pt demonstrates remarkable functional mobility for her age. She ambulated up to 200 ft with FWW and supervision with steady gait and no LOB. Pt became fatigued during ambulation but maintained SpO2 of 95%. Pt receives meals of wheels and has family to help her as needed. Recommending HHPT to assess home safety and to return to PLOF. Pt will benefit from skilled PT services to increase functional I and mobility for safe discharge.     Follow Up Recommendations Home health PT;Supervision - Intermittent    Equipment Recommendations  None recommended by PT    Recommendations for Other Services       Precautions / Restrictions Restrictions Weight Bearing Restrictions: No      Mobility  Bed Mobility Overal bed mobility: Modified Independent             General bed mobility comments: uses rails  Transfers Overall transfer level: Needs assistance Equipment used: Rolling walker (2 wheeled) Transfers: Sit to/from Stand;Stand Pivot Transfers Sit to Stand: Supervision Stand pivot transfers: Supervision       General transfer comment: steady with no LOB  Ambulation/Gait Ambulation/Gait assistance: Supervision Ambulation Distance (Feet): 200 Feet Assistive device: Rolling walker (2 wheeled) Gait Pattern/deviations: Decreased stride length;Narrow base of support     General Gait Details: Demonstrated steady gait with no LOB. Some SOB with SpO2 95%.  Stairs            Wheelchair Mobility    Modified Rankin (Stroke Patients Only)       Balance Overall balance assessment: History of  Falls;Needs assistance Sitting-balance support: Single extremity supported;Feet supported Sitting balance-Leahy Scale: Good Sitting balance - Comments: maintains independently   Standing balance support: Bilateral upper extremity supported Standing balance-Leahy Scale: Fair Standing balance comment: steady with no LOB                             Pertinent Vitals/Pain Pain Assessment: No/denies pain    Home Living Family/patient expects to be discharged to:: Private residence Living Arrangements: Alone Available Help at Discharge: Family Type of Home: House Home Access: Stairs to enter Entrance Stairs-Rails: Can reach both Entrance Stairs-Number of Steps: 3 Home Layout: One level Home Equipment: Walker - 2 wheels;Walker - 4 wheels;Cane - single point Additional Comments: Pt has life alert system. Transportation provided by family.    Prior Function Level of Independence: Independent with assistive device(s)         Comments: Pt on 2 L O2 at home. I ADLs and ambulates with rollator or cane.     Hand Dominance        Extremity/Trunk Assessment   Upper Extremity Assessment: Overall WFL for tasks assessed           Lower Extremity Assessment: Generalized weakness         Communication   Communication: HOH  Cognition Arousal/Alertness: Awake/alert Behavior During Therapy: WFL for tasks assessed/performed Overall Cognitive Status: Within Functional Limits for tasks assessed                      General Comments  Exercises Other Exercises Other Exercises: B LE supine therex: ankle pumps, heel slides, hip abd slides x10 each. Cues for correction. Therapeutic rest breaks for energy conservation.      Assessment/Plan    PT Assessment Patient needs continued PT services  PT Diagnosis Difficulty walking;Generalized weakness   PT Problem List Decreased strength;Decreased activity tolerance;Decreased balance;Decreased  mobility;Decreased safety awareness  PT Treatment Interventions Gait training;Stair training;Therapeutic activities;Therapeutic exercise;Balance training;Patient/family education   PT Goals (Current goals can be found in the Care Plan section) Acute Rehab PT Goals Patient Stated Goal: To go home PT Goal Formulation: With patient Time For Goal Achievement: 02/19/16 Potential to Achieve Goals: Good    Frequency Min 2X/week   Barriers to discharge Inaccessible home environment;Decreased caregiver support steps to enter    Co-evaluation               End of Session Equipment Utilized During Treatment: Gait belt;Oxygen Activity Tolerance: Patient limited by fatigue Patient left: in chair;with call bell/phone within reach;with chair alarm set Nurse Communication: Mobility status         Time: 1330-1400 PT Time Calculation (min) (ACUTE ONLY): 30 min   Charges:   PT Evaluation $PT Eval Low Complexity: 1 Procedure PT Treatments $Therapeutic Exercise: 8-22 mins   PT G Codes:        Neoma Laming, PT, DPT  02/05/2016, 3:25 PM [phone]

## 2016-02-05 NOTE — Progress Notes (Signed)
Patient having labored breathing. BP low . Prime doc paged awaiting response.

## 2016-02-05 NOTE — Progress Notes (Signed)
Patient ID: Kristen Mcdonald, female   DOB: Mar 25, 1913, 82 y.o.   MRN: [id number] [facility] PROGRESS NOTE  KYNSLEY TARVIN [id number] DOB: [birth year] DOA: 02/04/2016 PCP: Pcp Not In System  HPI/Subjective: Patient breathing a little bit better today. Still with some cough and shortness of breath.  Objective: Filed Vitals:   02/05/16 0755 02/05/16 1535  BP: 125/60 136/57  Pulse: 57 58  Temp:  98.5 F (36.9 C)  Resp: 16 18    Filed Weights   02/04/16 1129 02/04/16 1740  Weight: 68.584 kg (151 lb 3.2 oz) 67.132 kg (148 lb)    ROS: Review of Systems  Constitutional: Negative for fever and chills.  Eyes: Negative for blurred vision.  Respiratory: Positive for cough and shortness of breath.   Cardiovascular: Negative for chest pain.  Gastrointestinal: Negative for nausea, vomiting, abdominal pain, diarrhea and constipation.  Genitourinary: Negative for dysuria.  Musculoskeletal: Negative for joint pain.  Neurological: Negative for dizziness and headaches.   Exam: Physical Exam  Constitutional: She is oriented to person, place, and time.  HENT:  Nose: No mucosal edema.  Mouth/Throat: No oropharyngeal exudate or posterior oropharyngeal edema.  Eyes: Conjunctivae, EOM and lids are normal. Pupils are equal, round, and reactive to light.  Neck: No JVD present. Carotid bruit is not present. No edema present. No thyroid mass and no thyromegaly present.  Cardiovascular: S1 normal and S2 normal.  Exam reveals no gallop.   No murmur heard. Pulses:      Dorsalis pedis pulses are 2+ on the right side, and 2+ on the left side.  Respiratory: No respiratory distress. She has no wheezes. She has rhonchi in the right lower field and the left lower field. She has no rales.  GI: Soft. Bowel sounds are normal. There is no tenderness.  Musculoskeletal:       Right ankle: She exhibits swelling.       Left ankle: She exhibits swelling.  Lymphadenopathy:    She has no cervical  adenopathy.  Neurological: She is alert and oriented to person, place, and time. No cranial nerve deficit.  Skin: Skin is warm. No rash noted. Nails show no clubbing.  Psychiatric: She has a normal mood and affect.      Data Reviewed: Basic Metabolic Panel:  Recent Labs Lab 02/04/16 1236  NA 140  K 3.8  CL 103  CO2 29  GLUCOSE 128*  BUN 27*  CREATININE 1.12*  CALCIUM 8.9   Liver Function Tests:  Recent Labs Lab 02/04/16 1236  AST 18  ALT 14  ALKPHOS 69  BILITOT 0.4  PROT 6.6  ALBUMIN 3.8   CBC:  Recent Labs Lab 02/04/16 1236  WBC 13.0*  NEUTROABS 11.7*  HGB 10.6*  HCT 32.0*  MCV 89.7  PLT 199   Cardiac Enzymes:  Recent Labs Lab 02/04/16 1236  TROPONINI <0.03   BNP (last 3 results)  Recent Labs  02/20/15 1508 02/04/16 1236  BNP 189* 224.0*      Recent Results (from the past 240 hour(s))  Rapid Influenza A&B Antigens ([facility] only)     Status: None   Collection Time: 02/04/16 12:36 PM  Result Value Ref Range Status   Influenza A ([facility]) NEGATIVE NEGATIVE Final   Influenza B [facility]) NEGATIVE NEGATIVE Final     Studies: Dg Chest 2 View  02/04/2016  CLINICAL DATA:  Chest pain and tightness this morning. EXAM: CHEST  2 VIEW COMPARISON:  Chest x-rays dated 03/06/2015, 02/20/2015 and 03/04/2012 FINDINGS:  Cardiomegaly is stable. Overall cardiomediastinal silhouette appears stable in size and configuration. Atherosclerotic changes again seen at the aortic knob. Prominent interstitial markings again noted bilaterally, slightly increased compared to recent exams, most likely a combination of interstitial edema and fibrosis. No confluent opacity to suggest a developing pneumonia. No pleural effusion seen. No pneumothorax. Degenerative changes again noted within the thoracic spine and at the right shoulder. No acute osseous abnormality. IMPRESSION: 1. Cardiomegaly, stable. 2. Prominent interstitial markings bilaterally, most likely representing interstitial  edema superimposed on chronic interstitial lung disease. Electronically Signed   By: Franki Cabot M.D.   On: 02/04/2016 12:19   Ct Abdomen Pelvis W Contrast  02/04/2016  CLINICAL DATA:  Unable to get out of bed, LEFT abdominal pain, shortness of breath, chest pain, history CHF EXAM: CT ANGIOGRAPHY CHEST CT ABDOMEN AND PELVIS WITH CONTRAST TECHNIQUE: Multidetector CT imaging of the chest was performed using the standard protocol during bolus administration of intravenous contrast. Multiplanar CT image reconstructions and MIPs were obtained to evaluate the vascular anatomy. Multidetector CT imaging of the abdomen and pelvis was performed using the standard protocol during bolus administration of intravenous contrast. CONTRAST:  78mL OMNIPAQUE IOHEXOL 350 MG/ML SOLN IV. Dilute oral contrast. COMPARISON:  CT abdomen and pelvis 03/06/2015 FINDINGS: CTA CHEST FINDINGS Extensive atherosclerotic calcifications aorta, coronary arteries, and proximal great vessels. Suspected coronary stent in LAD. Aorta normal caliber without aneurysm, dissection, or intramural hematoma. Pulmonary arteries appear grossly patent on nondedicated exam, though assessment at the lower lobes is limited by respiratory motion. Small hiatal hernia. Scattered normal size mediastinal lymph nodes without thoracic adenopathy. Patchy BILATERAL pulmonary infiltrates. No pleural effusion or pneumothorax. Scattered AP tracheal narrowing. Few superior endplate compression deformities within thoracic spine. CT ABDOMEN and PELVIS FINDINGS RIGHT adrenal mass 2.0 x 0.9 cm image 20. BILATERAL hydronephrosis and hydroureter extending to a distended urinary bladder. Beam hardening artifacts in pelvis obscure structures including bladder, no gross bladder abnormality seen. Liver, spleen, pancreas, kidneys and LEFT adrenal gland otherwise normal. Normal sized ovaries with surgical absence of uterus. Sigmoid diverticulosis without evidence of diverticulitis. Stomach  and bowel loops otherwise normal appearance. Scattered atherosclerotic calcifications without aneurysm. No mass, adenopathy, free air or free fluid. Diffuse osseous demineralization with multilevel degenerative disc disease changes lumbar spine. Review of the MIP images confirms the above findings. IMPRESSION: Extensive atherosclerotic disease without aneurysm or dissection. Small hiatal hernia. Patchy BILATERAL pulmonary infiltrates with assessment of the lower lobes limited by respiratory motion artifacts. Distal colonic diverticulosis without evidence of diverticulitis. Stable RIGHT adrenal mass. BILATERAL hydronephrosis and hydroureter though this may be in part related to significant bladder distention. Electronically Signed   By: Lavonia Dana M.D.   On: 02/04/2016 15:34   Ct Angio Chest Aorta W/cm &/or Wo/cm  02/04/2016  CLINICAL DATA:  Unable to get out of bed, LEFT abdominal pain, shortness of breath, chest pain, history CHF EXAM: CT ANGIOGRAPHY CHEST CT ABDOMEN AND PELVIS WITH CONTRAST TECHNIQUE: Multidetector CT imaging of the chest was performed using the standard protocol during bolus administration of intravenous contrast. Multiplanar CT image reconstructions and MIPs were obtained to evaluate the vascular anatomy. Multidetector CT imaging of the abdomen and pelvis was performed using the standard protocol during bolus administration of intravenous contrast. CONTRAST:  53mL OMNIPAQUE IOHEXOL 350 MG/ML SOLN IV. Dilute oral contrast. COMPARISON:  CT abdomen and pelvis 03/06/2015 FINDINGS: CTA CHEST FINDINGS Extensive atherosclerotic calcifications aorta, coronary arteries, and proximal great vessels. Suspected coronary stent in LAD. Aorta normal caliber  without aneurysm, dissection, or intramural hematoma. Pulmonary arteries appear grossly patent on nondedicated exam, though assessment at the lower lobes is limited by respiratory motion. Small hiatal hernia. Scattered normal size mediastinal lymph nodes  without thoracic adenopathy. Patchy BILATERAL pulmonary infiltrates. No pleural effusion or pneumothorax. Scattered AP tracheal narrowing. Few superior endplate compression deformities within thoracic spine. CT ABDOMEN and PELVIS FINDINGS RIGHT adrenal mass 2.0 x 0.9 cm image 20. BILATERAL hydronephrosis and hydroureter extending to a distended urinary bladder. Beam hardening artifacts in pelvis obscure structures including bladder, no gross bladder abnormality seen. Liver, spleen, pancreas, kidneys and LEFT adrenal gland otherwise normal. Normal sized ovaries with surgical absence of uterus. Sigmoid diverticulosis without evidence of diverticulitis. Stomach and bowel loops otherwise normal appearance. Scattered atherosclerotic calcifications without aneurysm. No mass, adenopathy, free air or free fluid. Diffuse osseous demineralization with multilevel degenerative disc disease changes lumbar spine. Review of the MIP images confirms the above findings. IMPRESSION: Extensive atherosclerotic disease without aneurysm or dissection. Small hiatal hernia. Patchy BILATERAL pulmonary infiltrates with assessment of the lower lobes limited by respiratory motion artifacts. Distal colonic diverticulosis without evidence of diverticulitis. Stable RIGHT adrenal mass. BILATERAL hydronephrosis and hydroureter though this may be in part related to significant bladder distention. Electronically Signed   By: Lavonia Dana M.D.   On: 02/04/2016 15:34    Scheduled Meds: . amiodarone  200 mg Oral Daily  . aspirin EC  81 mg Oral Daily  . atorvastatin  10 mg Oral Daily  . azithromycin  500 mg Intravenous Q24H  . cefTRIAXone (ROCEPHIN)  IV  1 g Intravenous Q24H  . enoxaparin (LOVENOX) injection  30 mg Subcutaneous Q24H  . famotidine  20 mg Oral Daily  . levothyroxine  88 mcg Oral QAC breakfast  . sodium chloride flush  3 mL Intravenous Q12H    Assessment/Plan:  1. Pneumonia seen on CT scan. Leukocytosis. Hypotension  overnight. I will hold IV fluids at this time since the patient is 82 years old. Continue Rocephin and Zithromax. 2. Urinary retention with hydroureter and hydronephrosis. Foley catheter placed 3. Chronic respiratory failure on oxygen 24 7 4. History of CHF. Currently no signs hold IV fluids hold Lasix 5. Hypothyroidism unspecified on levothyroxine 6. Hyperlipidemia unspecified on atorvastatin 7. Gastroesophageal reflux disease on famotidine  Code Status:     Code Status Orders        Start     Ordered   02/04/16 1737  Full code   Continuous     02/04/16 1736    Code Status History    Date Active Date Inactive Code Status Order ID Comments User Context   This patient has a current code status but no historical code status.    Advance Directive Documentation        Most Recent Value   Type of Advance Directive  Healthcare Power of Attorney   Pre-existing out of facility DNR order (yellow form or pink MOST form)     "MOST" Form in Place?       Family Communication: Spoke with Niece on phone. She wont be available over the weekend. Disposition Plan: Home soon  Antibiotics:  Rocephin  zithromax  Time spent: 25 minutes  Loletha Grayer  [facility]

## 2016-02-05 NOTE — Progress Notes (Signed)
Dr.Koindena notified placed order for stat chest xray.

## 2016-02-05 NOTE — Progress Notes (Signed)
Dr.Williis notified and aware of patients s/s. Awaiting results from patients chest xray. Patient agitated and forgetful. Patient resting between care.

## 2016-02-06 MED ORDER — DEXTROSE 5 % IV SOLN
500.0000 mg | INTRAVENOUS | Status: DC
  Administered 2016-02-06 – 2016-02-07 (×2): 500 mg via INTRAVENOUS
  Filled 2016-02-06 (×3): qty 500

## 2016-02-06 MED ORDER — DOCUSATE SODIUM 100 MG PO CAPS
200.0000 mg | ORAL_CAPSULE | Freq: Two times a day (BID) | ORAL | Status: DC
  Administered 2016-02-06 – 2016-02-08 (×5): 200 mg via ORAL
  Filled 2016-02-06 (×5): qty 2

## 2016-02-06 MED ORDER — DEXTROSE 5 % IV SOLN
1.0000 g | INTRAVENOUS | Status: DC
  Administered 2016-02-06 – 2016-02-07 (×2): 1 g via INTRAVENOUS
  Filled 2016-02-06 (×3): qty 10

## 2016-02-06 MED ORDER — SENNA 8.6 MG PO TABS
1.0000 | ORAL_TABLET | Freq: Every day | ORAL | Status: DC
  Administered 2016-02-06 – 2016-02-08 (×3): 8.6 mg via ORAL
  Filled 2016-02-06 (×3): qty 1

## 2016-02-06 NOTE — Progress Notes (Addendum)
Patient ID: Kristen Mcdonald, female   DOB: May 20, 1913, 82 y.o.   MRN: [id number] [facility] PROGRESS NOTE  Kristen Mcdonald [id number] DOB: [birth year] DOA: 02/04/2016 PCP: Pcp Not In System  HPI/Subjective:  Patient breathing continues to improve. But on 2 L of oxygen. She is very hard of hearing.  Objective: Filed Vitals:   02/06/16 0339 02/06/16 0804  BP: 150/44 153/57  Pulse: 64 52  Temp:  98.1 F (36.7 C)  Resp: 18 17    Filed Weights   02/04/16 1129 02/04/16 1740  Weight: 68.584 kg (151 lb 3.2 oz) 67.132 kg (148 lb)    ROS: Review of Systems  Constitutional: Negative for fever and chills.  Eyes: Negative for blurred vision.  Respiratory: Positive for cough and shortness of breath.   Cardiovascular: Negative for chest pain.  Gastrointestinal: Negative for nausea, vomiting, abdominal pain, diarrhea and constipation.  Genitourinary: Negative for dysuria.  Musculoskeletal: Negative for joint pain.  Neurological: Negative for dizziness and headaches.   Exam: Physical Exam  Constitutional: She is oriented to person, place, and time.  HENT:  Nose: No mucosal edema.  Mouth/Throat: No oropharyngeal exudate or posterior oropharyngeal edema.  Eyes: Conjunctivae, EOM and lids are normal. Pupils are equal, round, and reactive to light.  Neck: No JVD present. Carotid bruit is not present. No edema present. No thyroid mass and no thyromegaly present.  Cardiovascular: S1 normal and S2 normal.  Exam reveals no gallop.   No murmur heard. Pulses:      Dorsalis pedis pulses are 2+ on the right side, and 2+ on the left side.  Respiratory: No respiratory distress. She has no wheezes. Occasional rhonchi GI: Soft. Bowel sounds are normal. There is no tenderness.  Musculoskeletal:       Right ankle: She exhibits swelling.       Left ankle: She exhibits swelling.  Lymphadenopathy:    She has no cervical adenopathy.  Neurological: She is alert and oriented to person,  place, and time. No cranial nerve deficit.  Skin: Skin is warm. No rash noted. Nails show no clubbing.  Psychiatric: She has a normal mood and affect.      Data Reviewed: Basic Metabolic Panel:  Recent Labs Lab 02/04/16 1236 02/05/16 2215  NA 140 141  K 3.8 3.8  CL 103 107  CO2 29 29  GLUCOSE 128* 146*  BUN 27* 27*  CREATININE 1.12* 1.21*  CALCIUM 8.9 8.5*   Liver Function Tests:  Recent Labs Lab 02/04/16 1236  AST 18  ALT 14  ALKPHOS 69  BILITOT 0.4  PROT 6.6  ALBUMIN 3.8   CBC:  Recent Labs Lab 02/04/16 1236 02/05/16 2215  WBC 13.0* 7.8  NEUTROABS 11.7* 5.1  HGB 10.6* 9.7*  HCT 32.0* 28.9*  MCV 89.7 89.3  PLT 199 200   Cardiac Enzymes:  Recent Labs Lab 02/04/16 1236  TROPONINI <0.03   BNP (last 3 results)  Recent Labs  02/20/15 1508 02/04/16 1236  BNP 189* 224.0*      Recent Results (from the past 240 hour(s))  Rapid Influenza A&B Antigens ([facility] only)     Status: None   Collection Time: 02/04/16 12:36 PM  Result Value Ref Range Status   Influenza A ([facility]) NEGATIVE NEGATIVE Final   Influenza B ([facility]) NEGATIVE NEGATIVE Final  Blood culture (routine x 2)     Status: None (Preliminary result)   Collection Time: 02/04/16  4:05 PM  Result Value Ref Range Status   Specimen  Description BLOOD RIGHT HAND  Final   Special Requests BAA,ANA,AER,5ML  Final   Culture NO GROWTH 2 DAYS  Final   Report Status PENDING  Incomplete  Blood culture (routine x 2)     Status: None (Preliminary result)   Collection Time: 02/04/16  5:55 PM  Result Value Ref Range Status   Specimen Description BLOOD LEFT ASSIST CONTROL  Final   Special Requests BAA,ANA,AER,5ML  Final   Culture NO GROWTH 2 DAYS  Final   Report Status PENDING  Incomplete     Studies: Dg Chest 1 View  02/05/2016  CLINICAL DATA:  Pneumonia with hypotension. EXAM: CHEST 1 VIEW COMPARISON:  02/04/2016 FINDINGS: 2021 hours. The cardio pericardial silhouette is enlarged. Interstitial markings  are diffusely coarsened with chronic features. Right hilar fullness on the current study is likely secondary to slight rightward rotation. Interval development of patchy airspace disease in the right mid lung, suspicious for pneumonia. Bones are diffusely demineralized. IMPRESSION: Cardiomegaly with chronic interstitial lung disease and interval development of right mid lung airspace disease, suspicious for pneumonia. Electronically Signed   By: Misty Stanley M.D.   On: 02/05/2016 20:53   Dg Chest 2 View  02/04/2016  CLINICAL DATA:  Chest pain and tightness this morning. EXAM: CHEST  2 VIEW COMPARISON:  Chest x-rays dated 03/06/2015, 02/20/2015 and 03/04/2012 FINDINGS: Cardiomegaly is stable. Overall cardiomediastinal silhouette appears stable in size and configuration. Atherosclerotic changes again seen at the aortic knob. Prominent interstitial markings again noted bilaterally, slightly increased compared to recent exams, most likely a combination of interstitial edema and fibrosis. No confluent opacity to suggest a developing pneumonia. No pleural effusion seen. No pneumothorax. Degenerative changes again noted within the thoracic spine and at the right shoulder. No acute osseous abnormality. IMPRESSION: 1. Cardiomegaly, stable. 2. Prominent interstitial markings bilaterally, most likely representing interstitial edema superimposed on chronic interstitial lung disease. Electronically Signed   By: Franki Cabot M.D.   On: 02/04/2016 12:19   Ct Abdomen Pelvis W Contrast  02/04/2016  CLINICAL DATA:  Unable to get out of bed, LEFT abdominal pain, shortness of breath, chest pain, history CHF EXAM: CT ANGIOGRAPHY CHEST CT ABDOMEN AND PELVIS WITH CONTRAST TECHNIQUE: Multidetector CT imaging of the chest was performed using the standard protocol during bolus administration of intravenous contrast. Multiplanar CT image reconstructions and MIPs were obtained to evaluate the vascular anatomy. Multidetector CT imaging of  the abdomen and pelvis was performed using the standard protocol during bolus administration of intravenous contrast. CONTRAST:  34mL OMNIPAQUE IOHEXOL 350 MG/ML SOLN IV. Dilute oral contrast. COMPARISON:  CT abdomen and pelvis 03/06/2015 FINDINGS: CTA CHEST FINDINGS Extensive atherosclerotic calcifications aorta, coronary arteries, and proximal great vessels. Suspected coronary stent in LAD. Aorta normal caliber without aneurysm, dissection, or intramural hematoma. Pulmonary arteries appear grossly patent on nondedicated exam, though assessment at the lower lobes is limited by respiratory motion. Small hiatal hernia. Scattered normal size mediastinal lymph nodes without thoracic adenopathy. Patchy BILATERAL pulmonary infiltrates. No pleural effusion or pneumothorax. Scattered AP tracheal narrowing. Few superior endplate compression deformities within thoracic spine. CT ABDOMEN and PELVIS FINDINGS RIGHT adrenal mass 2.0 x 0.9 cm image 20. BILATERAL hydronephrosis and hydroureter extending to a distended urinary bladder. Beam hardening artifacts in pelvis obscure structures including bladder, no gross bladder abnormality seen. Liver, spleen, pancreas, kidneys and LEFT adrenal gland otherwise normal. Normal sized ovaries with surgical absence of uterus. Sigmoid diverticulosis without evidence of diverticulitis. Stomach and bowel loops otherwise normal appearance. Scattered atherosclerotic calcifications  without aneurysm. No mass, adenopathy, free air or free fluid. Diffuse osseous demineralization with multilevel degenerative disc disease changes lumbar spine. Review of the MIP images confirms the above findings. IMPRESSION: Extensive atherosclerotic disease without aneurysm or dissection. Small hiatal hernia. Patchy BILATERAL pulmonary infiltrates with assessment of the lower lobes limited by respiratory motion artifacts. Distal colonic diverticulosis without evidence of diverticulitis. Stable RIGHT adrenal mass.  BILATERAL hydronephrosis and hydroureter though this may be in part related to significant bladder distention. Electronically Signed   By: Lavonia Dana M.D.   On: 02/04/2016 15:34   Ct Angio Chest Aorta W/cm &/or Wo/cm  02/04/2016  CLINICAL DATA:  Unable to get out of bed, LEFT abdominal pain, shortness of breath, chest pain, history CHF EXAM: CT ANGIOGRAPHY CHEST CT ABDOMEN AND PELVIS WITH CONTRAST TECHNIQUE: Multidetector CT imaging of the chest was performed using the standard protocol during bolus administration of intravenous contrast. Multiplanar CT image reconstructions and MIPs were obtained to evaluate the vascular anatomy. Multidetector CT imaging of the abdomen and pelvis was performed using the standard protocol during bolus administration of intravenous contrast. CONTRAST:  38mL OMNIPAQUE IOHEXOL 350 MG/ML SOLN IV. Dilute oral contrast. COMPARISON:  CT abdomen and pelvis 03/06/2015 FINDINGS: CTA CHEST FINDINGS Extensive atherosclerotic calcifications aorta, coronary arteries, and proximal great vessels. Suspected coronary stent in LAD. Aorta normal caliber without aneurysm, dissection, or intramural hematoma. Pulmonary arteries appear grossly patent on nondedicated exam, though assessment at the lower lobes is limited by respiratory motion. Small hiatal hernia. Scattered normal size mediastinal lymph nodes without thoracic adenopathy. Patchy BILATERAL pulmonary infiltrates. No pleural effusion or pneumothorax. Scattered AP tracheal narrowing. Few superior endplate compression deformities within thoracic spine. CT ABDOMEN and PELVIS FINDINGS RIGHT adrenal mass 2.0 x 0.9 cm image 20. BILATERAL hydronephrosis and hydroureter extending to a distended urinary bladder. Beam hardening artifacts in pelvis obscure structures including bladder, no gross bladder abnormality seen. Liver, spleen, pancreas, kidneys and LEFT adrenal gland otherwise normal. Normal sized ovaries with surgical absence of uterus. Sigmoid  diverticulosis without evidence of diverticulitis. Stomach and bowel loops otherwise normal appearance. Scattered atherosclerotic calcifications without aneurysm. No mass, adenopathy, free air or free fluid. Diffuse osseous demineralization with multilevel degenerative disc disease changes lumbar spine. Review of the MIP images confirms the above findings. IMPRESSION: Extensive atherosclerotic disease without aneurysm or dissection. Small hiatal hernia. Patchy BILATERAL pulmonary infiltrates with assessment of the lower lobes limited by respiratory motion artifacts. Distal colonic diverticulosis without evidence of diverticulitis. Stable RIGHT adrenal mass. BILATERAL hydronephrosis and hydroureter though this may be in part related to significant bladder distention. Electronically Signed   By: Lavonia Dana M.D.   On: 02/04/2016 15:34    Scheduled Meds: . amiodarone  200 mg Oral Daily  . aspirin EC  81 mg Oral Daily  . atorvastatin  10 mg Oral Daily  . docusate sodium  200 mg Oral BID  . enoxaparin (LOVENOX) injection  30 mg Subcutaneous Q24H  . famotidine  20 mg Oral Daily  . levothyroxine  88 mcg Oral QAC breakfast  . piperacillin-tazobactam (ZOSYN)  IV  3.375 g Intravenous 3 times per day  . senna  1 tablet Oral Daily  . sodium chloride flush  3 mL Intravenous Q12H  . vancomycin  1,000 mg Intravenous Q24H    Assessment/Plan:  1. Pneumonia seen on CT scan. Continue Rocephin and azithromycin. 2. Urinary retention with hydroureter and hydronephrosis. Foley catheter placed and will promote tomorrow 3. Chronic respiratory failure on oxygen 24 7  4. History of CHF. Currently no signs hold IV fluids hold Lasix 5. Hypothyroidism unspecified on levothyroxine 6. Hyperlipidemia unspecified on atorvastatin 7. Gastroesophageal reflux disease on famotidine  Code Status:     Code Status Orders        Start     Ordered   02/04/16 1737  Full code   Continuous     02/04/16 1736    Code Status  History    Date Active Date Inactive Code Status Order ID Comments User Context   This patient has a current code status but no historical code status.    Advance Directive Documentation        Most Recent Value   Type of Advance Directive  Healthcare Power of Attorney   Pre-existing out of facility DNR order (yellow form or pink MOST form)     "MOST" Form in Place?        Disposition Plan: Home soon  Antibiotics:  Rocephin  zithromax  Time spent: 25 minutes  Ricke Kimoto, [facility]

## 2016-02-06 NOTE — Progress Notes (Signed)
Pt with bradycardia . Pt on cardarone. Pt also with no bm since admission and h/o bowel obtruction. rn informed dr patel on rounds. md acknowledged

## 2016-02-07 ENCOUNTER — Inpatient Hospital Stay: Payer: Medicare Other

## 2016-02-07 NOTE — Progress Notes (Signed)
Per dr patel. rn may remove foley

## 2016-02-07 NOTE — Progress Notes (Signed)
Per dr patel. Do not call md re: bradycardia except when pulse <50

## 2016-02-07 NOTE — Care Management Important Message (Signed)
Important Message  Patient Details  Name: Kristen Mcdonald MRN: [id number] Date of Birth: [birth year]   Medicare Important Message Given:  Yes    Ashvik Grundman A, RN 02/07/2016, 4:10 PM

## 2016-02-07 NOTE — Progress Notes (Signed)
Patient ID: Kristen Mcdonald, female   DOB: 27-Aug-1913, 82 y.o.   MRN: [id number] [facility] PROGRESS NOTE  Kristen Mcdonald DOB: [birth year] DOA: 02/04/2016 PCP: Pcp Not In System  HPI/Subjective:  Patient is complaining of being very weak. Seems to be breathing well heavier.  Objective: Filed Vitals:   02/07/16 0458 02/07/16 0739  BP: 109/55 122/52  Pulse: 55 53  Temp: 97.8 F (36.6 C) 98.2 F (36.8 C)  Resp: 24 16    Filed Weights   02/04/16 1129 02/04/16 1740  Weight: 68.584 kg (151 lb 3.2 oz) 67.132 kg (148 lb)    ROS: Review of Systems  Constitutional: Negative for fever and chills.  positive weakness Eyes: Negative for blurred vision.  Respiratory: Positive for cough and shortness of breath.   Cardiovascular: Negative for chest pain.  Gastrointestinal: Negative for nausea, vomiting, abdominal pain, diarrhea and constipation.  Genitourinary: Negative for dysuria.  Musculoskeletal: Negative for joint pain.  Neurological: Negative for dizziness and headaches.   Exam: Physical Exam  Constitutional: She is oriented to person, place, and time.  HENT:  Nose: No mucosal edema.  Mouth/Throat: No oropharyngeal exudate or posterior oropharyngeal edema.  Eyes: Conjunctivae, EOM and lids are normal. Pupils are equal, round, and reactive to light.  Neck: No JVD present. Carotid bruit is not present. No edema present. No thyroid mass and no thyromegaly present.  Cardiovascular: S1 normal and S2 normal.  Exam reveals no gallop.   No murmur heard. Pulses:      Dorsalis pedis pulses are 2+ on the right side, and 2+ on the left side.  Respiratory: No respiratory distress. She has no wheezes. Occasional rhonchi GI: Soft. Bowel sounds are normal. There is no tenderness.  Musculoskeletal:       Right ankle: She exhibits swelling.       Left ankle: She exhibits swelling.  Lymphadenopathy:    She has no cervical adenopathy.  Neurological: She is alert  and oriented to person, place, and time. No cranial nerve deficit.  Skin: Skin is warm. No rash noted. Nails show no clubbing.  Psychiatric: She has a normal mood and affect.      Data Reviewed: Basic Metabolic Panel:  Recent Labs Lab 02/04/16 1236 02/05/16 2215  NA 140 141  K 3.8 3.8  CL 103 107  CO2 29 29  GLUCOSE 128* 146*  BUN 27* 27*  CREATININE 1.12* 1.21*  CALCIUM 8.9 8.5*   Liver Function Tests:  Recent Labs Lab 02/04/16 1236  AST 18  ALT 14  ALKPHOS 69  BILITOT 0.4  PROT 6.6  ALBUMIN 3.8   CBC:  Recent Labs Lab 02/04/16 1236 02/05/16 2215  WBC 13.0* 7.8  NEUTROABS 11.7* 5.1  HGB 10.6* 9.7*  HCT 32.0* 28.9*  MCV 89.7 89.3  PLT 199 200   Cardiac Enzymes:  Recent Labs Lab 02/04/16 1236  TROPONINI <0.03   BNP (last 3 results)  Recent Labs  02/20/15 1508 02/04/16 1236  BNP 189* 224.0*      Recent Results (from the past 240 hour(s))  Rapid Influenza A&B Antigens ([facility] only)     Status: None   Collection Time: 02/04/16 12:36 PM  Result Value Ref Range Status   Influenza A ([facility]) NEGATIVE NEGATIVE Final   Influenza B ([facility]) NEGATIVE NEGATIVE Final  Blood culture (routine x 2)     Status: None (Preliminary result)   Collection Time: 02/04/16  4:05 PM  Result Value Ref Range Status  Specimen Description BLOOD RIGHT HAND  Final   Special Requests BAA,ANA,AER,5ML  Final   Culture NO GROWTH 2 DAYS  Final   Report Status PENDING  Incomplete  Blood culture (routine x 2)     Status: None (Preliminary result)   Collection Time: 02/04/16  5:55 PM  Result Value Ref Range Status   Specimen Description BLOOD LEFT ASSIST CONTROL  Final   Special Requests BAA,ANA,AER,5ML  Final   Culture NO GROWTH 2 DAYS  Final   Report Status PENDING  Incomplete     Studies: Dg Chest 1 View  02/05/2016  CLINICAL DATA:  Pneumonia with hypotension. EXAM: CHEST 1 VIEW COMPARISON:  02/04/2016 FINDINGS: 2021 hours. The cardio pericardial silhouette is enlarged.  Interstitial markings are diffusely coarsened with chronic features. Right hilar fullness on the current study is likely secondary to slight rightward rotation. Interval development of patchy airspace disease in the right mid lung, suspicious for pneumonia. Bones are diffusely demineralized. IMPRESSION: Cardiomegaly with chronic interstitial lung disease and interval development of right mid lung airspace disease, suspicious for pneumonia. Electronically Signed   By: Misty Stanley M.D.   On: 02/05/2016 20:53    Scheduled Meds: . amiodarone  200 mg Oral Daily  . aspirin EC  81 mg Oral Daily  . atorvastatin  10 mg Oral Daily  . azithromycin  500 mg Intravenous Q24H  . cefTRIAXone (ROCEPHIN)  IV  1 g Intravenous Q24H  . docusate sodium  200 mg Oral BID  . enoxaparin (LOVENOX) injection  30 mg Subcutaneous Q24H  . famotidine  20 mg Oral Daily  . levothyroxine  88 mcg Oral QAC breakfast  . senna  1 tablet Oral Daily  . sodium chloride flush  3 mL Intravenous Q12H    Assessment/Plan:  1. Pneumonia seen on CT scan. Continue Rocephin and azithromycin. Repeat chest x-ray 2. Urinary retention with hydroureter and hydronephrosis. Remote Foley 3. chronic respiratory failure on oxygen 24 7 4. History of CHF. Currently no signs hold IV fluids hold Lasix 5. Hypothyroidism unspecified on levothyroxine 6. Hyperlipidemia unspecified on atorvastatin 7. Gastroesophageal reflux disease on famotidine  Code Status:     Code Status Orders        Start     Ordered   02/04/16 1737  Full code   Continuous     02/04/16 1736    Code Status History    Date Active Date Inactive Code Status Order ID Comments User Context   This patient has a current code status but no historical code status.    Advance Directive Documentation        Most Recent Value   Type of Advance Directive  Healthcare Power of Attorney   Pre-existing out of facility DNR order (yellow form or pink MOST form)     "MOST" Form in  Place?        Disposition Plan: Home soon  Antibiotics:  Rocephin  zithromax  Time spent: 25 minutes  Rosie Torrez, [facility]

## 2016-02-08 LAB — BASIC METABOLIC PANEL
ANION GAP: 5 (ref 5–15)
BUN: 18 mg/dL (ref 6–20)
CHLORIDE: 106 mmol/L (ref 101–111)
CO2: 30 mmol/L (ref 22–32)
Calcium: 8.7 mg/dL — ABNORMAL LOW (ref 8.9–10.3)
Creatinine, Ser: 0.99 mg/dL (ref 0.44–1.00)
GFR, EST AFRICAN AMERICAN: 52 mL/min — AB (ref >60)
GFR, EST NON AFRICAN AMERICAN: 45 mL/min — AB (ref >60)
Glucose, Bld: 106 mg/dL — ABNORMAL HIGH (ref 65–99)
POTASSIUM: 3.8 mmol/L (ref 3.5–5.1)
SODIUM: 141 mmol/L (ref 135–145)

## 2016-02-08 LAB — CBC
HCT: 30.4 % — ABNORMAL LOW (ref 35.0–47.0)
HEMOGLOBIN: 10.1 g/dL — AB (ref 12.0–16.0)
MCH: 30.4 pg (ref 26.0–34.0)
MCHC: 33.1 g/dL (ref 32.0–36.0)
MCV: 91.8 fL (ref 80.0–100.0)
PLATELETS: 200 10*3/uL (ref 150–440)
RBC: 3.31 MIL/uL — AB (ref 3.80–5.20)
RDW: 14.2 % (ref 11.5–14.5)
WBC: 7.2 10*3/uL (ref 3.6–11.0)

## 2016-02-08 MED ORDER — CEFUROXIME AXETIL 500 MG PO TABS: 500.0000 mg | ORAL_TABLET | Freq: Two times a day (BID) | ORAL | Status: AC

## 2016-02-08 MED ORDER — GUAIFENESIN 100 MG/5ML PO SOLN: 5.0000 mL | ORAL | Status: AC | PRN

## 2016-02-08 NOTE — Progress Notes (Signed)
Physical Therapy Treatment Patient Details Name: Kristen Mcdonald MRN: [id number] DOB: February 23, 1913 Today's Date: 02/08/2016    History of Present Illness 82 yo F presented to ED from home due to L abdominal pain, weakness and SOB found to have pneumonia. PMH includes CHF, thyroid disease, GERD, MI, Cancer, Mastectomy, Coronary angioplasty wiht stent placement.    PT Comments    Pt demonstrated progression towards goals. She ambulated up to 225 ft with FWW and supervision. Ascended/Descended 4 steps with B rails and min guard with no LOB. On 2 L O2 and ranged 95 to 99% SpO2, HR 61 to 77 bpm. She will benefit from continued skilled PT to increase functional I and safety.  Follow Up Recommendations  Home health PT;Supervision - Intermittent     Equipment Recommendations  None recommended by PT    Recommendations for Other Services       Precautions / Restrictions Restrictions Weight Bearing Restrictions: No    Mobility  Bed Mobility Overal bed mobility: Modified Independent             General bed mobility comments: uses rails  Transfers Overall transfer level: Needs assistance Equipment used: Rolling walker (2 wheeled) Transfers: Sit to/from Stand;Stand Pivot Transfers Sit to Stand: Supervision Stand pivot transfers: Supervision       General transfer comment: steady with no LOB  Ambulation/Gait Ambulation/Gait assistance: Supervision Ambulation Distance (Feet): 225 Feet Assistive device: Rolling walker (2 wheeled) Gait Pattern/deviations: Decreased stride length;Narrow base of support     General Gait Details: Demonstrated steady gait with no LOB. Some SOB with SpO2 95%.   Stairs Stairs: Yes Stairs assistance: Min guard Stair Management: Two rails Number of Stairs: 4 General stair comments: steady, no LOB  Wheelchair Mobility    Modified Rankin (Stroke Patients Only)       Balance Overall balance assessment: Needs assistance Sitting-balance  support: No upper extremity supported Sitting balance-Leahy Scale: Good Sitting balance - Comments: maintains independently   Standing balance support: No upper extremity supported Standing balance-Leahy Scale: Fair Standing balance comment: no LOB                    Cognition Arousal/Alertness: Awake/alert Behavior During Therapy: WFL for tasks assessed/performed Overall Cognitive Status: Within Functional Limits for tasks assessed                      Exercises      General Comments        Pertinent Vitals/Pain Pain Assessment: No/denies pain    Home Living                      Prior Function            PT Goals (current goals can now be found in the care plan section) Acute Rehab PT Goals Patient Stated Goal: To go home PT Goal Formulation: With patient Time For Goal Achievement: 02/19/16 Potential to Achieve Goals: Good Progress towards PT goals: Progressing toward goals    Frequency  Min 2X/week    PT Plan Current plan remains appropriate    Co-evaluation             End of Session Equipment Utilized During Treatment: Gait belt;Oxygen Activity Tolerance: Patient tolerated treatment well Patient left: in chair;with call bell/phone within reach;with chair alarm set     Time: 1136-1200 PT Time Calculation (min) (ACUTE ONLY): 24 min  Charges:  $Gait Training: 8-22 mins $Therapeutic Activity:  8-22 mins                    G Codes:      Neoma Laming, PT, DPT  02/08/2016, 12:14 PM [phone]

## 2016-02-08 NOTE — Care Management Note (Signed)
Case Management Note  Patient Details  Name: Kristen Mcdonald MRN: [id number] Date of Birth: February 16, 1913  Subjective/Objective:      A referral for home health PT and RN was called to Kristen Mcdonald at [facility]. Kristen Mcdonald chose Kristen Mcdonald to be her home health provider.   Action/Plan:   Expected Discharge Date:                  Expected Discharge Plan:     In-House Referral:     Discharge planning Services     Post Acute Care Choice:    Choice offered to:     DME Arranged:    DME Agency:     HH Arranged:    Kristen Mcdonald Agency:     Status of Service:     Medicare Important Message Given:  Yes Date Medicare IM Given:    Medicare IM give by:    Date Additional Medicare IM Given:    Additional Medicare Important Message give by:     If discussed at [place] of Stay Meetings, dates discussed:    Additional Comments:  Kristen Mcdonald A, RN 02/08/2016, 11:09 AM

## 2016-02-08 NOTE — Discharge Summary (Signed)
Kristen Mcdonald, 82 y.o., DOB [birth year], MRN [id number]. Admission date: 02/04/2016 Discharge Date 02/08/2016 Primary MD Pcp Not In System Admitting Physician Demetrios Loll, MD  Admission Diagnosis  Weakness [R53.1] Left upper quadrant pain [R10.12] Community acquired pneumonia [J18.9] Chest pain, unspecified chest pain type [R07.9]  Discharge Diagnosis   Principal Problem:   Community-acquired pneumonia Chronic respiratory failure  Urinary retention with hydroureter and hydronephrosis status post Foley placement and removal will History of CHF currently compensated unknown type Hypothyroidism unspecified Hyperlipidemia GERD       Hospital Course Kristen Mcdonald is a 82 y.o. female with a known history of COPD, CHF and heart attack. The patient was sent to ED due to left-sided abdominal pain and generalized weakness today. Patient was evaluated with a CT of the abdomen which showed bilateral pneumonia. Patient was started on antibiotics with resolution of her abdominal pain. She was continued on antibiotics. She is doing much better. And is sent just to go home. She was seen by physical therapy they recommended home health. Patient also was noted to have bilateral hydronephrosis. Therefore had a Foley placed. At her age of 81 I don't think she is a candidate for any aggressive intervention. Her Foley is now removed. She denies any urinary symptoms.            Consults  None  Significant Tests:  See full reports for all details    Dg Chest 1 View  02/07/2016  CLINICAL DATA:  Shortness of breath, prior mastectomy EXAM: CHEST 1 VIEW COMPARISON:  02/05/2016 FINDINGS: Increased interstitial markings, chronic, with bilateral lower lobe scarring. Superimposed mild patchy right upper lobe opacity, suspicious for pneumonia. No pleural effusion or pneumothorax. Mild cardiomegaly. IMPRESSION: Mild patchy right upper lobe opacity, suspicious for pneumonia. Underlying chronic interstitial  markings with bilateral lower lobe scarring. Electronically Signed   By: Julian Hy M.D.   On: 02/07/2016 11:20   Dg Chest 1 View  02/05/2016  CLINICAL DATA:  Pneumonia with hypotension. EXAM: CHEST 1 VIEW COMPARISON:  02/04/2016 FINDINGS: 2021 hours. The cardio pericardial silhouette is enlarged. Interstitial markings are diffusely coarsened with chronic features. Right hilar fullness on the current study is likely secondary to slight rightward rotation. Interval development of patchy airspace disease in the right mid lung, suspicious for pneumonia. Bones are diffusely demineralized. IMPRESSION: Cardiomegaly with chronic interstitial lung disease and interval development of right mid lung airspace disease, suspicious for pneumonia. Electronically Signed   By: Misty Stanley M.D.   On: 02/05/2016 20:53   Dg Chest 2 View  02/04/2016  CLINICAL DATA:  Chest pain and tightness this morning. EXAM: CHEST  2 VIEW COMPARISON:  Chest x-rays dated 03/06/2015, 02/20/2015 and 03/04/2012 FINDINGS: Cardiomegaly is stable. Overall cardiomediastinal silhouette appears stable in size and configuration. Atherosclerotic changes again seen at the aortic knob. Prominent interstitial markings again noted bilaterally, slightly increased compared to recent exams, most likely a combination of interstitial edema and fibrosis. No confluent opacity to suggest a developing pneumonia. No pleural effusion seen. No pneumothorax. Degenerative changes again noted within the thoracic spine and at the right shoulder. No acute osseous abnormality. IMPRESSION: 1. Cardiomegaly, stable. 2. Prominent interstitial markings bilaterally, most likely representing interstitial edema superimposed on chronic interstitial lung disease. Electronically Signed   By: Franki Cabot M.D.   On: 02/04/2016 12:19   Ct Abdomen Pelvis W Contrast  02/04/2016  CLINICAL DATA:  Unable to get out of bed, LEFT abdominal pain, shortness of breath, chest pain, history  CHF  EXAM: CT ANGIOGRAPHY CHEST CT ABDOMEN AND PELVIS WITH CONTRAST TECHNIQUE: Multidetector CT imaging of the chest was performed using the standard protocol during bolus administration of intravenous contrast. Multiplanar CT image reconstructions and MIPs were obtained to evaluate the vascular anatomy. Multidetector CT imaging of the abdomen and pelvis was performed using the standard protocol during bolus administration of intravenous contrast. CONTRAST:  61mL OMNIPAQUE IOHEXOL 350 MG/ML SOLN IV. Dilute oral contrast. COMPARISON:  CT abdomen and pelvis 03/06/2015 FINDINGS: CTA CHEST FINDINGS Extensive atherosclerotic calcifications aorta, coronary arteries, and proximal great vessels. Suspected coronary stent in LAD. Aorta normal caliber without aneurysm, dissection, or intramural hematoma. Pulmonary arteries appear grossly patent on nondedicated exam, though assessment at the lower lobes is limited by respiratory motion. Small hiatal hernia. Scattered normal size mediastinal lymph nodes without thoracic adenopathy. Patchy BILATERAL pulmonary infiltrates. No pleural effusion or pneumothorax. Scattered AP tracheal narrowing. Few superior endplate compression deformities within thoracic spine. CT ABDOMEN and PELVIS FINDINGS RIGHT adrenal mass 2.0 x 0.9 cm image 20. BILATERAL hydronephrosis and hydroureter extending to a distended urinary bladder. Beam hardening artifacts in pelvis obscure structures including bladder, no gross bladder abnormality seen. Liver, spleen, pancreas, kidneys and LEFT adrenal gland otherwise normal. Normal sized ovaries with surgical absence of uterus. Sigmoid diverticulosis without evidence of diverticulitis. Stomach and bowel loops otherwise normal appearance. Scattered atherosclerotic calcifications without aneurysm. No mass, adenopathy, free air or free fluid. Diffuse osseous demineralization with multilevel degenerative disc disease changes lumbar spine. Review of the MIP images  confirms the above findings. IMPRESSION: Extensive atherosclerotic disease without aneurysm or dissection. Small hiatal hernia. Patchy BILATERAL pulmonary infiltrates with assessment of the lower lobes limited by respiratory motion artifacts. Distal colonic diverticulosis without evidence of diverticulitis. Stable RIGHT adrenal mass. BILATERAL hydronephrosis and hydroureter though this may be in part related to significant bladder distention. Electronically Signed   By: Lavonia Dana M.D.   On: 02/04/2016 15:34   Ct Angio Chest Aorta W/cm &/or Wo/cm  02/04/2016  CLINICAL DATA:  Unable to get out of bed, LEFT abdominal pain, shortness of breath, chest pain, history CHF EXAM: CT ANGIOGRAPHY CHEST CT ABDOMEN AND PELVIS WITH CONTRAST TECHNIQUE: Multidetector CT imaging of the chest was performed using the standard protocol during bolus administration of intravenous contrast. Multiplanar CT image reconstructions and MIPs were obtained to evaluate the vascular anatomy. Multidetector CT imaging of the abdomen and pelvis was performed using the standard protocol during bolus administration of intravenous contrast. CONTRAST:  72mL OMNIPAQUE IOHEXOL 350 MG/ML SOLN IV. Dilute oral contrast. COMPARISON:  CT abdomen and pelvis 03/06/2015 FINDINGS: CTA CHEST FINDINGS Extensive atherosclerotic calcifications aorta, coronary arteries, and proximal great vessels. Suspected coronary stent in LAD. Aorta normal caliber without aneurysm, dissection, or intramural hematoma. Pulmonary arteries appear grossly patent on nondedicated exam, though assessment at the lower lobes is limited by respiratory motion. Small hiatal hernia. Scattered normal size mediastinal lymph nodes without thoracic adenopathy. Patchy BILATERAL pulmonary infiltrates. No pleural effusion or pneumothorax. Scattered AP tracheal narrowing. Few superior endplate compression deformities within thoracic spine. CT ABDOMEN and PELVIS FINDINGS RIGHT adrenal mass 2.0 x 0.9 cm  image 20. BILATERAL hydronephrosis and hydroureter extending to a distended urinary bladder. Beam hardening artifacts in pelvis obscure structures including bladder, no gross bladder abnormality seen. Liver, spleen, pancreas, kidneys and LEFT adrenal gland otherwise normal. Normal sized ovaries with surgical absence of uterus. Sigmoid diverticulosis without evidence of diverticulitis. Stomach and bowel loops otherwise normal appearance. Scattered atherosclerotic calcifications without aneurysm. No mass, adenopathy,  free air or free fluid. Diffuse osseous demineralization with multilevel degenerative disc disease changes lumbar spine. Review of the MIP images confirms the above findings. IMPRESSION: Extensive atherosclerotic disease without aneurysm or dissection. Small hiatal hernia. Patchy BILATERAL pulmonary infiltrates with assessment of the lower lobes limited by respiratory motion artifacts. Distal colonic diverticulosis without evidence of diverticulitis. Stable RIGHT adrenal mass. BILATERAL hydronephrosis and hydroureter though this may be in part related to significant bladder distention. Electronically Signed   By: Lavonia Dana M.D.   On: 02/04/2016 15:34       Today   Subjective:   Kristen Mcdonald  feels well was to go home  Objective:   Blood pressure 120/54, pulse 61, temperature 97.5 F (36.4 C), temperature source Oral, resp. rate 18, height 5\' 2"  (1.575 m), weight 67.132 kg (148 lb), SpO2 98 %.  .  Intake/Output Summary (Last 24 hours) at 02/08/16 1235 Last data filed at 02/08/16 0630  Gross per 24 hour  Intake   1080 ml  Output   1200 ml  Net   -120 ml    Exam VITAL SIGNS: Blood pressure 120/54, pulse 61, temperature 97.5 F (36.4 C), temperature source Oral, resp. rate 18, height 5\' 2"  (1.575 m), weight 67.132 kg (148 lb), SpO2 98 %.  GENERAL:  82 y.o.-year-old patient lying in the bed with no acute distress.  EYES: Pupils equal, round, reactive to light and  accommodation. No scleral icterus. Extraocular muscles intact.  HEENT: Head atraumatic, normocephalic. Oropharynx and nasopharynx clear.  NECK:  Supple, no jugular venous distention. No thyroid enlargement, no tenderness.  LUNGS: Normal breath sounds bilaterally, no wheezing, rales,rhonchi or crepitation. No use of accessory muscles of respiration.  CARDIOVASCULAR: S1, S2 normal. No murmurs, rubs, or gallops.  ABDOMEN: Soft, nontender, nondistended. Bowel sounds present. No organomegaly or mass.  EXTREMITIES: No pedal edema, cyanosis, or clubbing.  NEUROLOGIC: Cranial nerves II through XII are intact. Muscle strength 5/5 in all extremities. Sensation intact. Gait not checked.  PSYCHIATRIC: The patient is alert and oriented x 3.  SKIN: No obvious rash, lesion, or ulcer.   Data Review     CBC w Diff: Lab Results  Component Value Date   WBC 7.2 02/08/2016   WBC 11.4* 03/09/2015   HGB 10.1* 02/08/2016   HGB 10.2* 03/09/2015   HCT 30.4* 02/08/2016   HCT 32.1* 03/09/2015   PLT 200 02/08/2016   PLT 275 03/09/2015   LYMPHOPCT 21 02/05/2016   LYMPHOPCT 10.1 03/09/2015   MONOPCT 12 02/05/2016   MONOPCT 8.4 03/09/2015   EOSPCT 2 02/05/2016   EOSPCT 2.0 03/09/2015   BASOPCT 0 02/05/2016   BASOPCT 0.2 03/09/2015   CMP: Lab Results  Component Value Date   NA 141 02/08/2016   NA 141 03/09/2015   K 3.8 02/08/2016   K 4.2 03/09/2015   CL 106 02/08/2016   CL 106 03/09/2015   CO2 30 02/08/2016   CO2 28 03/09/2015   BUN 18 02/08/2016   BUN 5* 03/09/2015   CREATININE 0.99 02/08/2016   CREATININE 0.76 03/09/2015   PROT 6.6 02/04/2016   PROT 5.2* 03/07/2015   ALBUMIN 3.8 02/04/2016   ALBUMIN 2.5* 03/07/2015   BILITOT 0.4 02/04/2016   BILITOT 0.3 03/07/2015   ALKPHOS 69 02/04/2016   ALKPHOS 62 03/07/2015   AST 18 02/04/2016   AST 20 03/07/2015   ALT 14 02/04/2016   ALT 15 03/07/2015  .  Micro Results Recent Results (from the past 240 hour(s))  Rapid Influenza  A&B Antigens  ([place] only)     Status: None   Collection Time: 02/04/16 12:36 PM  Result Value Ref Range Status   Influenza A ([facility]) NEGATIVE NEGATIVE Final   Influenza B ([facility]) NEGATIVE NEGATIVE Final  Blood culture (routine x 2)     Status: None (Preliminary result)   Collection Time: 02/04/16  4:05 PM  Result Value Ref Range Status   Specimen Description BLOOD RIGHT HAND  Final   Special Requests BAA,ANA,AER,5ML  Final   Culture NO GROWTH 4 DAYS  Final   Report Status PENDING  Incomplete  Blood culture (routine x 2)     Status: None (Preliminary result)   Collection Time: 02/04/16  5:55 PM  Result Value Ref Range Status   Specimen Description BLOOD LEFT ASSIST CONTROL  Final   Special Requests BAA,ANA,AER,5ML  Final   Culture NO GROWTH 4 DAYS  Final   Report Status PENDING  Incomplete        Code Status Orders        Start     Ordered   02/04/16 1737  Full code   Continuous     02/04/16 1736    Code Status History    Date Active Date Inactive Code Status Order ID Comments User Context   This patient has a current code status but no historical code status.    Advance Directive Documentation        Most Recent Value   Type of Advance Directive  Healthcare Power of Attorney   Pre-existing out of facility DNR order (yellow form or pink MOST form)     "MOST" Form in Place?            Follow-up Information    Follow up with pcp In 7 days.      Discharge Medications     Medication List    TAKE these medications        amiodarone 200 MG tablet  Commonly known as:  PACERONE  Take 200 mg by mouth daily.     aspirin EC 81 MG tablet  Take 81 mg by mouth daily.     atorvastatin 10 MG tablet  Commonly known as:  LIPITOR  Take 10 mg by mouth daily.     calcium-vitamin D 500-200 MG-UNIT tablet  Commonly known as:  OSCAL WITH D  Take 1 tablet by mouth 2 (two) times daily.     cefUROXime 500 MG tablet  Commonly known as:  CEFTIN  Take 1 tablet (500 mg total) by mouth 2  (two) times daily with a meal.     furosemide 40 MG tablet  Commonly known as:  LASIX  Take 40 mg by mouth daily. Take one tablet by mouth every day. May take an extra tablet after lunch if ankles are swollen     guaiFENesin 100 MG/5ML Soln  Commonly known as:  ROBITUSSIN  Take 5 mLs (100 mg total) by mouth every 4 (four) hours as needed for cough or to loosen phlegm.     levothyroxine 88 MCG tablet  Commonly known as:  SYNTHROID, LEVOTHROID  Take 88 mcg by mouth daily before breakfast.     multivitamin with minerals Tabs tablet  Take 1 tablet by mouth daily.     potassium chloride 10 MEQ tablet  Commonly known as:  K-DUR,KLOR-CON  Take 10 mEq by mouth daily.     ranitidine 150 MG tablet  Commonly known as:  ZANTAC  Take 150 mg by mouth  2 (two) times daily.     solifenacin 5 MG tablet  Commonly known as:  VESICARE  Take 5 mg by mouth daily.           Total Time in preparing paper work, data evaluation and todays exam - 35 minutes  Dustin Flock M.D on 02/08/2016 at 12:35 PM  [facility]   Office  [phone]

## 2016-02-08 NOTE — Discharge Instructions (Signed)
°  DIET:  Cardiac diet  DISCHARGE CONDITION:  Stable  ACTIVITY:  Activity as tolerated  OXYGEN:  Home Oxygen:  2l    Oxygen Delivery: 2 liters/min via Patient connected to nasal cannula oxygen  DISCHARGE LOCATION:  home    ADDITIONAL DISCHARGE INSTRUCTION:   If you experience worsening of your admission symptoms, develop shortness of breath, life threatening emergency, suicidal or homicidal thoughts you must seek medical attention immediately by calling 911 or calling your MD immediately  if symptoms less severe.  You Must read complete instructions/literature along with all the possible adverse reactions/side effects for all the Medicines you take and that have been prescribed to you. Take any new Medicines after you have completely understood and accpet all the possible adverse reactions/side effects.   Please note  You were cared for by a hospitalist during your hospital stay. If you have any questions about your discharge medications or the care you received while you were in the hospital after you are discharged, you can call the unit and asked to speak with the hospitalist on call if the hospitalist that took care of you is not available. Once you are discharged, your primary care physician will handle any further medical issues. Please note that NO REFILLS for any discharge medications will be authorized once you are discharged, as it is imperative that you return to your primary care physician (or establish a relationship with a primary care physician if you do not have one) for your aftercare needs so that they can reassess your need for medications and monitor your lab values.

## 2016-02-09 LAB — CULTURE, BLOOD (ROUTINE X 2)
CULTURE: NO GROWTH
Culture: NO GROWTH

## 2016-05-24 ENCOUNTER — Emergency Department: Payer: Medicare Other

## 2016-05-24 ENCOUNTER — Encounter: Payer: Self-pay | Admitting: Medical Oncology

## 2016-05-24 ENCOUNTER — Inpatient Hospital Stay
Admission: EM | Admit: 2016-05-24 | Discharge: 2016-05-28 | DRG: 470 | Disposition: A | Discharge status: Skilled Nursing Facility | Payer: Medicare Other | Attending: Internal Medicine | Admitting: Internal Medicine

## 2016-05-24 DIAGNOSIS — S72002A Fracture of unspecified part of neck of left femur, initial encounter for closed fracture: Principal | ICD-10-CM | POA: Diagnosis present

## 2016-05-24 DIAGNOSIS — Z823 Family history of stroke: Secondary | ICD-10-CM

## 2016-05-24 DIAGNOSIS — K219 Gastro-esophageal reflux disease without esophagitis: Secondary | ICD-10-CM | POA: Diagnosis present

## 2016-05-24 DIAGNOSIS — H9190 Unspecified hearing loss, unspecified ear: Secondary | ICD-10-CM | POA: Diagnosis present

## 2016-05-24 DIAGNOSIS — I509 Heart failure, unspecified: Secondary | ICD-10-CM | POA: Diagnosis present

## 2016-05-24 DIAGNOSIS — J9610 Chronic respiratory failure, unspecified whether with hypoxia or hypercapnia: Secondary | ICD-10-CM | POA: Diagnosis present

## 2016-05-24 DIAGNOSIS — Z96649 Presence of unspecified artificial hip joint: Secondary | ICD-10-CM

## 2016-05-24 DIAGNOSIS — T3995XA Adverse effect of unspecified nonopioid analgesic, antipyretic and antirheumatic, initial encounter: Secondary | ICD-10-CM | POA: Diagnosis present

## 2016-05-24 DIAGNOSIS — E039 Hypothyroidism, unspecified: Secondary | ICD-10-CM | POA: Diagnosis present

## 2016-05-24 DIAGNOSIS — Z96641 Presence of right artificial hip joint: Secondary | ICD-10-CM | POA: Diagnosis present

## 2016-05-24 DIAGNOSIS — S72009A Fracture of unspecified part of neck of unspecified femur, initial encounter for closed fracture: Secondary | ICD-10-CM | POA: Diagnosis present

## 2016-05-24 DIAGNOSIS — I252 Old myocardial infarction: Secondary | ICD-10-CM

## 2016-05-24 DIAGNOSIS — Z7982 Long term (current) use of aspirin: Secondary | ICD-10-CM

## 2016-05-24 DIAGNOSIS — J449 Chronic obstructive pulmonary disease, unspecified: Secondary | ICD-10-CM | POA: Diagnosis present

## 2016-05-24 DIAGNOSIS — E785 Hyperlipidemia, unspecified: Secondary | ICD-10-CM | POA: Diagnosis present

## 2016-05-24 DIAGNOSIS — Z955 Presence of coronary angioplasty implant and graft: Secondary | ICD-10-CM

## 2016-05-24 DIAGNOSIS — Z66 Do not resuscitate: Secondary | ICD-10-CM | POA: Diagnosis present

## 2016-05-24 DIAGNOSIS — Z79899 Other long term (current) drug therapy: Secondary | ICD-10-CM

## 2016-05-24 DIAGNOSIS — W19XXXA Unspecified fall, initial encounter: Secondary | ICD-10-CM | POA: Diagnosis present

## 2016-05-24 DIAGNOSIS — Z9981 Dependence on supplemental oxygen: Secondary | ICD-10-CM

## 2016-05-24 DIAGNOSIS — I952 Hypotension due to drugs: Secondary | ICD-10-CM | POA: Diagnosis present

## 2016-05-24 LAB — URINALYSIS COMPLETE WITH MICROSCOPIC (ARMC ONLY)
BILIRUBIN URINE: NEGATIVE
GLUCOSE, UA: NEGATIVE mg/dL
KETONES UR: NEGATIVE mg/dL
NITRITE: NEGATIVE
PH: 6 (ref 5.0–8.0)
Protein, ur: NEGATIVE mg/dL
SPECIFIC GRAVITY, URINE: 1.008 (ref 1.005–1.030)

## 2016-05-24 LAB — CBC WITH DIFFERENTIAL/PLATELET
Basophils Absolute: 0 10*3/uL (ref 0–0.1)
Basophils Relative: 1 %
EOS ABS: 0.1 10*3/uL (ref 0–0.7)
Eosinophils Relative: 1 %
HCT: 34.1 % — ABNORMAL LOW (ref 35.0–47.0)
HEMOGLOBIN: 11.4 g/dL — AB (ref 12.0–16.0)
LYMPHS ABS: 1.4 10*3/uL (ref 1.0–3.6)
Lymphocytes Relative: 19 %
MCH: 30.1 pg (ref 26.0–34.0)
MCHC: 33.4 g/dL (ref 32.0–36.0)
MCV: 90.3 fL (ref 80.0–100.0)
MONO ABS: 0.6 10*3/uL (ref 0.2–0.9)
Neutro Abs: 5.2 10*3/uL (ref 1.4–6.5)
Platelets: 276 10*3/uL (ref 150–440)
RBC: 3.78 MIL/uL — ABNORMAL LOW (ref 3.80–5.20)
RDW: 14.7 % — AB (ref 11.5–14.5)
WBC: 7.3 10*3/uL (ref 3.6–11.0)

## 2016-05-24 LAB — BASIC METABOLIC PANEL
Anion gap: 12 (ref 5–15)
BUN: 18 mg/dL (ref 6–20)
CHLORIDE: 105 mmol/L (ref 101–111)
CO2: 23 mmol/L (ref 22–32)
CREATININE: 1.16 mg/dL — AB (ref 0.44–1.00)
Calcium: 9 mg/dL (ref 8.9–10.3)
GFR calc non Af Amer: 37 mL/min — ABNORMAL LOW (ref >60)
GFR, EST AFRICAN AMERICAN: 43 mL/min — AB (ref >60)
GLUCOSE: 131 mg/dL — AB (ref 65–99)
Potassium: 3.6 mmol/L (ref 3.5–5.1)
Sodium: 140 mmol/L (ref 135–145)

## 2016-05-24 LAB — PROTIME-INR
INR: 0.97
PROTHROMBIN TIME: 13.1 s (ref 11.4–15.0)

## 2016-05-24 MED ORDER — ADULT MULTIVITAMIN W/MINERALS CH
1.0000 | ORAL_TABLET | Freq: Every day | ORAL | Status: DC
  Administered 2016-05-26 – 2016-05-28 (×3): 1 via ORAL
  Filled 2016-05-24 (×3): qty 1

## 2016-05-24 MED ORDER — FENTANYL CITRATE (PF) 100 MCG/2ML IJ SOLN
25.0000 ug | Freq: Once | INTRAMUSCULAR | Status: AC
  Administered 2016-05-24: 25 ug via INTRAVENOUS

## 2016-05-24 MED ORDER — LEVOTHYROXINE SODIUM 88 MCG PO TABS
88.0000 ug | ORAL_TABLET | Freq: Every day | ORAL | Status: DC
  Administered 2016-05-26 – 2016-05-28 (×3): 88 ug via ORAL
  Filled 2016-05-24 (×4): qty 1

## 2016-05-24 MED ORDER — CLINDAMYCIN PHOSPHATE 900 MG/50ML IV SOLN
900.0000 mg | Freq: Three times a day (TID) | INTRAVENOUS | Status: DC
  Administered 2016-05-25: 900 mg via INTRAVENOUS
  Filled 2016-05-24 (×3): qty 50

## 2016-05-24 MED ORDER — SODIUM CHLORIDE 0.9 % IV BOLUS (SEPSIS)
250.0000 mL | Freq: Once | INTRAVENOUS | Status: AC
  Administered 2016-05-24: 250 mL via INTRAVENOUS

## 2016-05-24 MED ORDER — METHOCARBAMOL 1000 MG/10ML IJ SOLN: 500.0000 mg | Freq: Four times a day (QID) | INTRAVENOUS | Status: DC | PRN

## 2016-05-24 MED ORDER — FAMOTIDINE 20 MG PO TABS
20.0000 mg | ORAL_TABLET | Freq: Every day | ORAL | Status: DC
  Administered 2016-05-25 – 2016-05-28 (×4): 20 mg via ORAL
  Filled 2016-05-24 (×4): qty 1

## 2016-05-24 MED ORDER — CEFAZOLIN IN D5W 1 GM/50ML IV SOLN
1.0000 g | Freq: Once | INTRAVENOUS | Status: DC
  Filled 2016-05-24: qty 50

## 2016-05-24 MED ORDER — ZOLPIDEM TARTRATE 5 MG PO TABS
5.0000 mg | ORAL_TABLET | Freq: Every evening | ORAL | Status: DC | PRN
  Administered 2016-05-25 – 2016-05-27 (×2): 5 mg via ORAL
  Filled 2016-05-24 (×2): qty 1

## 2016-05-24 MED ORDER — ONDANSETRON HCL 4 MG/2ML IJ SOLN
4.0000 mg | Freq: Once | INTRAMUSCULAR | Status: AC
  Administered 2016-05-24: 4 mg via INTRAVENOUS

## 2016-05-24 MED ORDER — ATORVASTATIN CALCIUM 10 MG PO TABS
10.0000 mg | ORAL_TABLET | Freq: Every day | ORAL | Status: DC
  Administered 2016-05-26 – 2016-05-28 (×3): 10 mg via ORAL
  Filled 2016-05-24 (×4): qty 1

## 2016-05-24 MED ORDER — SENNA 8.6 MG PO TABS
1.0000 | ORAL_TABLET | Freq: Two times a day (BID) | ORAL | Status: DC
  Administered 2016-05-25 – 2016-05-28 (×6): 8.6 mg via ORAL
  Filled 2016-05-24 (×7): qty 1

## 2016-05-24 MED ORDER — AMIODARONE HCL 200 MG PO TABS
200.0000 mg | ORAL_TABLET | Freq: Every day | ORAL | Status: DC
  Administered 2016-05-25 – 2016-05-28 (×4): 200 mg via ORAL
  Filled 2016-05-24 (×4): qty 1

## 2016-05-24 MED ORDER — FENTANYL CITRATE (PF) 100 MCG/2ML IJ SOLN
25.0000 ug | Freq: Once | INTRAMUSCULAR | Status: AC
  Administered 2016-05-24: 25 ug via INTRAVENOUS
  Filled 2016-05-24: qty 2

## 2016-05-24 MED ORDER — GUAIFENESIN 100 MG/5ML PO SOLN: 5.0000 mL | ORAL | Status: DC | PRN

## 2016-05-24 MED ORDER — MORPHINE SULFATE (PF) 2 MG/ML IV SOLN
1.0000 mg | INTRAVENOUS | Status: DC | PRN
  Administered 2016-05-24 – 2016-05-25 (×2): 1 mg via INTRAVENOUS
  Filled 2016-05-24: qty 1

## 2016-05-24 MED ORDER — CELECOXIB 200 MG PO CAPS: 200.0000 mg | ORAL_CAPSULE | Freq: Every day | ORAL | Status: DC

## 2016-05-24 MED ORDER — SODIUM CHLORIDE 0.9 % IV SOLN
INTRAVENOUS | Status: DC
  Administered 2016-05-25 (×3): via INTRAVENOUS

## 2016-05-24 MED ORDER — CALCIUM CARBONATE-VITAMIN D 500-200 MG-UNIT PO TABS
1.0000 | ORAL_TABLET | Freq: Two times a day (BID) | ORAL | Status: DC
  Administered 2016-05-25 – 2016-05-28 (×6): 1 via ORAL
  Filled 2016-05-24 (×6): qty 1

## 2016-05-24 MED ORDER — POTASSIUM CHLORIDE CRYS ER 10 MEQ PO TBCR
10.0000 meq | EXTENDED_RELEASE_TABLET | Freq: Every day | ORAL | Status: DC
  Administered 2016-05-25 – 2016-05-28 (×4): 10 meq via ORAL
  Filled 2016-05-24 (×4): qty 1

## 2016-05-24 MED ORDER — METHOCARBAMOL 500 MG PO TABS: 500.0000 mg | ORAL_TABLET | Freq: Four times a day (QID) | ORAL | Status: DC | PRN

## 2016-05-24 MED ORDER — HYDROCODONE-ACETAMINOPHEN 5-325 MG PO TABS
1.0000 | ORAL_TABLET | Freq: Four times a day (QID) | ORAL | Status: DC | PRN
  Administered 2016-05-25 – 2016-05-28 (×5): 1 via ORAL
  Filled 2016-05-24 (×5): qty 1

## 2016-05-24 MED ORDER — FUROSEMIDE 40 MG PO TABS: 40.0000 mg | ORAL_TABLET | Freq: Every day | ORAL | Status: DC

## 2016-05-24 MED ORDER — MORPHINE SULFATE (PF) 2 MG/ML IV SOLN
INTRAVENOUS | Status: AC
  Administered 2016-05-24: 1 mg via INTRAVENOUS
  Filled 2016-05-24: qty 1

## 2016-05-24 NOTE — ED Notes (Signed)
Admitting MD at bedside.

## 2016-05-24 NOTE — ED Notes (Signed)
Pt from home, reports she was taking the trash out and didn't have her cane and she fell, pt reports left hip pain. Reports hitting her head but did not lose conscious. Pt takes ASA at home.

## 2016-05-24 NOTE — ED Provider Notes (Addendum)
[facility] Emergency Department Provider Note  ____________________________________________   I have reviewed the triage vital signs and the nursing notes.   HISTORY  Chief Complaint Fall and Hip Pain    HPI MIYLA TOMA is a 82 y.o. female fell today and a nonstick fall. She was not using her cane. Landed her left hip. May have bumped her head cannot remember. Did not pass out however. On aspirin but no other blood thinners. No other complaints aside from the pain at this time. States the pain is not bad unless we are moving her.       Past Medical History  Diagnosis Date  . CHF (congestive heart failure) ([place])   . Thyroid disease   . GERD (gastroesophageal reflux disease)   . Anemia   . COPD (chronic obstructive pulmonary disease) ([place])   . Arthritis   . Heart attack ([place])   . Cancer [facility])     Patient Active Problem List   Diagnosis Date Noted  . Pneumonia 02/04/2016    Past Surgical History  Procedure Laterality Date  . Vesicovaginal fistula closure w/ tah    . Mastectomy    . Abdominal hysterectomy    . Appendectomy    . Coronary angioplasty with stent placement      Current Outpatient Rx  Name  Route  Sig  Dispense  Refill  . amiodarone (PACERONE) 200 MG tablet   Oral   Take 200 mg by mouth daily.         Marland Kitchen aspirin EC 81 MG tablet   Oral   Take 81 mg by mouth daily.         Marland Kitchen atorvastatin (LIPITOR) 10 MG tablet   Oral   Take 10 mg by mouth daily.         . calcium-vitamin D (OSCAL WITH D) 500-200 MG-UNIT tablet   Oral   Take 1 tablet by mouth 2 (two) times daily.         . furosemide (LASIX) 40 MG tablet   Oral   Take 40 mg by mouth daily. Take one tablet by mouth every day. May take an extra tablet after lunch if ankles are swollen         . guaiFENesin (ROBITUSSIN) 100 MG/5ML SOLN   Oral   Take 5 mLs (100 mg total) by mouth every 4 (four) hours as needed for cough or to loosen phlegm.   1200 mL    0   . levothyroxine (SYNTHROID, LEVOTHROID) 88 MCG tablet   Oral   Take 88 mcg by mouth daily before breakfast.         . Multiple Vitamin (MULTIVITAMIN WITH MINERALS) TABS tablet   Oral   Take 1 tablet by mouth daily.         . potassium chloride (K-DUR,KLOR-CON) 10 MEQ tablet   Oral   Take 10 mEq by mouth daily.         . ranitidine (ZANTAC) 150 MG tablet   Oral   Take 150 mg by mouth 2 (two) times daily.         . solifenacin (VESICARE) 5 MG tablet   Oral   Take 5 mg by mouth daily.           Allergies Review of patient's allergies indicates no known allergies.  Family History  Problem Relation Age of Onset  . Stroke Mother   . Cancer Father     Social History Social History  Substance Use Topics  . Smoking status: Never Smoker   . Smokeless tobacco: None  . Alcohol Use: No    Review of Systems Constitutional: No fever/chills Eyes: No visual changes. ENT: No sore throat. No stiff neck no neck pain Cardiovascular: Denies chest pain. Respiratory: Denies shortness of breath. Gastrointestinal:   no vomiting.  No diarrhea.  No constipation. Genitourinary: Negative for dysuria. Musculoskeletal: Negative lower extremity swelling Skin: Negative for rash. Neurological: Negative for headaches, focal weakness or numbness. 10-point ROS otherwise negative.  ____________________________________________   PHYSICAL EXAM:  VITAL SIGNS: ED Triage Vitals  Enc Vitals Group     BP 05/24/16 1732 116/92 mmHg     Pulse Rate 05/24/16 1732 61     Resp 05/24/16 1732 18     Temp --      Temp src --      SpO2 05/24/16 1732 93 %     Weight 05/24/16 1732 148 lb (67.132 kg)     Height --      Head Cir --      Peak Flow --      Pain Score 05/24/16 1734 10     Pain Loc --      Pain Edu? --      Excl. in [place]? --     Constitutional: Alert and oriented. Well appearing and in no acute distress. Eyes: Conjunctivae are normal. PERRL. EOMI. Head: Atraumatic. Nose:  No congestion/rhinnorhea. Mouth/Throat: Mucous membranes are moist.  Oropharynx non-erythematous. Neck: No stridor.   Nontender with no meningismus Cardiovascular: Normal rate, regular rhythm. Grossly normal heart sounds.  Good peripheral circulation. Respiratory: Normal respiratory effort.  No retractions. Lungs CTAB. Abdominal: Soft and nontender. No distention. No guarding no rebound Back:  There is no focal tenderness or step off there is no midline tenderness there are no lesions noted. there is no CVA tenderness Musculoskeletal: Positive tenderness to palpation left hip positive + foreshortening No lower extremity tenderness. No joint effusions, no DVT signs strong distal pulses no edema Neurologic:  Normal speech and language. No gross focal neurologic deficits are appreciated.  Skin:  Skin is warm, dry and intact. No rash noted. Psychiatric: Mood and affect are normal. Speech and behavior are normal.  ____________________________________________   LABS (all labs ordered are listed, but only abnormal results are displayed)  Labs Reviewed  CBC WITH DIFFERENTIAL/PLATELET  URINALYSIS COMPLETEWITH MICROSCOPIC ([facility] ONLY)  BASIC METABOLIC PANEL  CBC WITH DIFFERENTIAL/PLATELET  PROTIME-INR   ____________________________________________  EKG  I personally interpreted any EKGs ordered by me or triage Normal sinus rhythm rate 56 bpm, bradycardia noted, no acute ST elevation or depression, 2 PVCs are noted, normal axis, no acute ischemia ____________________________________________  RADIOLOGY  I reviewed any imaging ordered by me or triage that were performed during my shift and, if possible, patient and/or family made aware of any abnormal findings. ____________________________________________   PROCEDURES  Procedure(s) performed: None  Critical Care performed: None  ____________________________________________   INITIAL IMPRESSION / ASSESSMENT AND PLAN / ED  COURSE  Pertinent labs & imaging results that were available during my care of the patient were reviewed by me and considered in my medical decision making (see chart for details).  Patient with likely left hip fracture. We will initiate the workup neurovascular intact.  Discussed with Dr. Sabra Heck who agrees with plan discussed with hospitalist who will admit ____________________________________________   FINAL CLINICAL IMPRESSION(S) / ED DIAGNOSES  Final diagnoses:  None      This chart was  dictated using voice recognition software.  Despite best efforts to proofread,  errors can occur which can change meaning.     Schuyler Amor, MD 05/24/16 [place], MD 05/24/16 [phone]

## 2016-05-24 NOTE — H&P (Signed)
[facility] NAME: Kristen Mcdonald    MR#:  [id number]  DATE OF BIRTH:  [birth year]  DATE OF ADMISSION:  05/24/2016  PRIMARY CARE PHYSICIAN: Jerolyn Center  REQUESTING/REFERRING PHYSICIAN: Dr. Burlene Arnt  CHIEF COMPLAINT:   Chief Complaint  Patient presents with  . Fall  . Hip Pain    HISTORY OF PRESENT ILLNESS:  Kristen Mcdonald  is a 82 y.o. female. She presented to the hospital when she went outside to take out the trash and had a fall. She flagged down some people on the street and they carried her into her home. She was having severe pain in her left hip area. In the ER she was found to have a left fracture. No loss of consciousness with her fall. She lives independently at her home by herself and gets around pretty well. No complaints of chest pain. She does have some shortness of breath and she wears oxygen 24/ 7. After some pain medication and her blood pressure did come down a little bit.  PAST MEDICAL HISTORY:   Past Medical History  Diagnosis Date  . CHF (congestive heart failure) ([place])   . Thyroid disease   . GERD (gastroesophageal reflux disease)   . Anemia   . COPD (chronic obstructive pulmonary disease) ([place])   . Arthritis   . Heart attack ([place])   . Cancer [facility])     PAST SURGICAL HISTORY:   Past Surgical History  Procedure Laterality Date  . Vesicovaginal fistula closure w/ tah    . Mastectomy    . Abdominal hysterectomy    . Appendectomy    . Coronary angioplasty with stent placement    . Right hip surgery    . Abdominal blockage      SOCIAL HISTORY:   Social History  Substance Use Topics  . Smoking status: Never Smoker   . Smokeless tobacco: Not on file  . Alcohol Use: No    FAMILY HISTORY:   Family History  Problem Relation Age of Onset  . Stroke Mother   . Cancer Father     DRUG ALLERGIES:  No Known Allergies  REVIEW OF SYSTEMS:  CONSTITUTIONAL: No fever, fatigue or  weakness.  EYES: No blurred or double vision. Wears glasses EARS, NOSE, AND THROAT: No tinnitus or ear pain. No sore throat. Loss of hearing. RESPIRATORY: No cough, positive for shortness of breath, no wheezing or hemoptysis.  CARDIOVASCULAR: No chest pain, orthopnea, edema.  GASTROINTESTINAL: Some nausea. No vomiting, diarrhea or abdominal pain. No blood in bowel movements. Some diarrhea for a few weeks and now constipation GENITOURINARY: No dysuria, hematuria.  ENDOCRINE: No polyuria, nocturia,  HEMATOLOGY: No anemia, easy bruising or bleeding SKIN: No rash or lesion. MUSCULOSKELETAL: Positive for hip pain NEUROLOGIC: No tingling, numbness, weakness.  PSYCHIATRY: No anxiety or depression.   MEDICATIONS AT HOME:   Prior to Admission medications   Medication Sig Start Date End Date Taking? Authorizing Provider  amiodarone (PACERONE) 200 MG tablet Take 200 mg by mouth daily.   Yes Historical Provider, MD  aspirin EC 81 MG tablet Take 81 mg by mouth daily.   Yes Historical Provider, MD  atorvastatin (LIPITOR) 10 MG tablet Take 10 mg by mouth daily.   Yes Historical Provider, MD  calcium-vitamin D (OSCAL WITH D) 500-200 MG-UNIT tablet Take 1 tablet by mouth 2 (two) times daily.   Yes Historical Provider, MD  furosemide (LASIX) 40 MG tablet Take 40 mg by mouth daily.  Pt is able to take an additional tablet daily if needed for edema.   Yes Historical Provider, MD  guaiFENesin (ROBITUSSIN) 100 MG/5ML SOLN Take 5 mLs (100 mg total) by mouth every 4 (four) hours as needed for cough or to loosen phlegm. 02/08/16  Yes Dustin Flock, MD  levothyroxine (SYNTHROID, LEVOTHROID) 88 MCG tablet Take 88 mcg by mouth daily before breakfast.   Yes Historical Provider, MD  Multiple Vitamin (MULTIVITAMIN WITH MINERALS) TABS tablet Take 1 tablet by mouth daily.   Yes Historical Provider, MD  potassium chloride (K-DUR,KLOR-CON) 10 MEQ tablet Take 10 mEq by mouth daily.   Yes Historical Provider, MD  ranitidine  (ZANTAC) 150 MG tablet Take 150 mg by mouth 2 (two) times daily.   Yes Historical Provider, MD  solifenacin (VESICARE) 5 MG tablet Take 5 mg by mouth daily.   Yes Historical Provider, MD      VITAL SIGNS:  Blood pressure 116/92, pulse 61, resp. rate 18, weight 67.132 kg (148 lb), SpO2 93 %.  PHYSICAL EXAMINATION:  GENERAL:  82 y.o.-year-old patient lying in the bed with no acute distress.  EYES: Pupils equal, round, reactive to light and accommodation. No scleral icterus. Extraocular muscles intact.  HEENT: Head atraumatic, normocephalic. Oropharynx and nasopharynx clear.  NECK:  Supple, no jugular venous distention. No thyroid enlargement, no tenderness.  LUNGS: Normal breath sounds bilaterally, no wheezing, rales,rhonchi or crepitation. No use of accessory muscles of respiration.  CARDIOVASCULAR: S1, S2 normal. 2/6 systolic murmurs. No rubs, or gallops.  ABDOMEN: Soft, nontender, nondistended. Bowel sounds present. No organomegaly or mass.  EXTREMITIES: Trace edema. No cyanosis, or clubbing. Left leg shortened and externally rotated NEUROLOGIC: Cranial nerves II through XII are intact except for poor hearing. Sensation intact. Gait not checked.  PSYCHIATRIC: The patient is alert.  SKIN: No rash, lesion, or ulcer.   LABORATORY PANEL:   CBC  Recent Labs Lab 05/24/16 1752  WBC 7.3  HGB 11.4*  HCT 34.1*  PLT 276   ------------------------------------------------------------------------------------------------------------------  Chemistries   Recent Labs Lab 05/24/16 1752  NA 140  K 3.6  CL 105  CO2 23  GLUCOSE 131*  BUN 18  CREATININE 1.16*  CALCIUM 9.0   ------------------------------------------------------------------------------------------------------------------    RADIOLOGY:  Dg Chest 1 View  05/24/2016  CLINICAL DATA:  Recent fall with left hip fracture, initial encounter EXAM: CHEST 1 VIEW COMPARISON:  02/07/2016 FINDINGS: Cardiac shadow is enlarged but  stable. Diffuse aortic calcifications are again seen. Scattered interstitial changes are noted bilaterally without focal infiltrate or sizable effusion. No acute bony abnormality is noted. IMPRESSION: Chronic changes without acute abnormality. Aortic atherosclerotic disease. Electronically Signed   By: Inez Catalina M.D.   On: 05/24/2016 18:26   Ct Head Wo Contrast  05/24/2016  CLINICAL DATA:  Recent fall with known left hip fracture and closed head injury, initial encounter EXAM: CT HEAD WITHOUT CONTRAST CT CERVICAL SPINE WITHOUT CONTRAST TECHNIQUE: Multidetector CT imaging of the head and cervical spine was performed following the standard protocol without intravenous contrast. Multiplanar CT image reconstructions of the cervical spine were also generated. COMPARISON:  01/03/2007 FINDINGS: CT HEAD FINDINGS Bony calvarium is intact. Diffuse atrophic changes and chronic white matter ischemic changes are noted. No findings to suggest acute hemorrhage, acute infarction or space-occupying mass lesion are seen. CT CERVICAL SPINE FINDINGS Seven cervical segments are well visualized. Disc space narrowing and facet hypertrophic changes are noted at multiple levels. No findings to suggest acute fracture or acute facet abnormality are seen.  Surrounding soft tissues demonstrate diffuse carotid calcifications. Multiple thyroid nodules are noted. Most prominent of these lies on the right and measures approximately 2 cm in greatest dimension. Visualized lung apices demonstrate mild changes of COPD. IMPRESSION: CT of the head: Chronic atrophic and ischemic changes without acute abnormality. CT of the cervical spine: Multilevel degenerative change without acute abnormality. Right thyroid nodule measuring over 2 cm. This was present on a prior CT of the chest from 20/11 and likely benign in etiology given the lack of change. Electronically Signed   By: Inez Catalina M.D.   On: 05/24/2016 18:43   Ct Cervical Spine Wo  Contrast  05/24/2016  CLINICAL DATA:  Recent fall with known left hip fracture and closed head injury, initial encounter EXAM: CT HEAD WITHOUT CONTRAST CT CERVICAL SPINE WITHOUT CONTRAST TECHNIQUE: Multidetector CT imaging of the head and cervical spine was performed following the standard protocol without intravenous contrast. Multiplanar CT image reconstructions of the cervical spine were also generated. COMPARISON:  01/03/2007 FINDINGS: CT HEAD FINDINGS Bony calvarium is intact. Diffuse atrophic changes and chronic white matter ischemic changes are noted. No findings to suggest acute hemorrhage, acute infarction or space-occupying mass lesion are seen. CT CERVICAL SPINE FINDINGS Seven cervical segments are well visualized. Disc space narrowing and facet hypertrophic changes are noted at multiple levels. No findings to suggest acute fracture or acute facet abnormality are seen. Surrounding soft tissues demonstrate diffuse carotid calcifications. Multiple thyroid nodules are noted. Most prominent of these lies on the right and measures approximately 2 cm in greatest dimension. Visualized lung apices demonstrate mild changes of COPD. IMPRESSION: CT of the head: Chronic atrophic and ischemic changes without acute abnormality. CT of the cervical spine: Multilevel degenerative change without acute abnormality. Right thyroid nodule measuring over 2 cm. This was present on a prior CT of the chest from 20/11 and likely benign in etiology given the lack of change. Electronically Signed   By: Inez Catalina M.D.   On: 05/24/2016 18:43   Dg Hip Unilat With Pelvis 2-3 Views Left  05/24/2016  CLINICAL DATA:  Recent fall left hip pain, initial encounter EXAM: DG HIP (WITH OR WITHOUT PELVIS) 2-3V LEFT COMPARISON:  None. FINDINGS: There is evidence of subcapital femoral neck fracture with impaction and angulation at the fracture site. Prior right hip prosthesis is noted. The pelvic ring is intact. Lumbar degenerative changes  are seen. IMPRESSION: Proximal left femoral fracture as described. Electronically Signed   By: Inez Catalina M.D.   On: 05/24/2016 18:26    EKG:   Sinus bradycardia 56 bpm. PVCs, LAD, LAFB, flattening of T waves laterally  IMPRESSION AND PLAN:   1. Preoperative evaluation for left hip fracture closed requiring operative repair initial evaluation. No contraindications to surgery at this time. Can proceed to surgery. I would not do any further cardiac testing. Patient and family understand risks of surgery. The patient family also understand the risks and the high mortality within the first year of a hip fracture especially if the patient does not walk again. 2. COPD with chronic respiratory failure on chronic oxygen. Respiratory status currently stable. 3. History of congestive heart failure. Currently no signs of heart failure and her mother actually starting fluids for low blood pressure. 4. Relative hypotension secondary to pain medication. Give a small IV fluid bolus and gentle IV fluid hydration. 5. History of arrhythmia on amiodarone 6. Hypothyroidism unspecified on levothyroxine 7. Hyperlipidemia unspecified on Lipitor  All the records are reviewed and case  discussed with ED provider. Management plans discussed with the patient, family and they are in agreement.  CODE STATUS: DO NOT RESUSCITATE  TOTAL TIME TAKING CARE OF THIS PATIENT: 55 minutes.    Loletha Grayer M.D on 05/24/2016 at 8:19 PM  Between 7am to 6pm - Pager - [phone]  After 6pm call admission pager [phone]  [facility]  [phone]  CC: Primary care physician; Pcp Not In System

## 2016-05-25 ENCOUNTER — Encounter
Admission: EM | Disposition: A | Discharge status: Skilled Nursing Facility | Payer: Self-pay | Source: Home / Self Care | Attending: Internal Medicine

## 2016-05-25 ENCOUNTER — Inpatient Hospital Stay: Payer: Medicare Other

## 2016-05-25 ENCOUNTER — Encounter: Payer: Self-pay | Admitting: Anesthesiology

## 2016-05-25 ENCOUNTER — Inpatient Hospital Stay: Payer: Medicare Other | Admitting: Anesthesiology

## 2016-05-25 DIAGNOSIS — S72009A Fracture of unspecified part of neck of unspecified femur, initial encounter for closed fracture: Secondary | ICD-10-CM | POA: Diagnosis present

## 2016-05-25 DIAGNOSIS — Z823 Family history of stroke: Secondary | ICD-10-CM | POA: Diagnosis not present

## 2016-05-25 DIAGNOSIS — Z79899 Other long term (current) drug therapy: Secondary | ICD-10-CM | POA: Diagnosis not present

## 2016-05-25 DIAGNOSIS — T3995XA Adverse effect of unspecified nonopioid analgesic, antipyretic and antirheumatic, initial encounter: Secondary | ICD-10-CM | POA: Diagnosis present

## 2016-05-25 DIAGNOSIS — I252 Old myocardial infarction: Secondary | ICD-10-CM | POA: Diagnosis not present

## 2016-05-25 DIAGNOSIS — K219 Gastro-esophageal reflux disease without esophagitis: Secondary | ICD-10-CM | POA: Diagnosis present

## 2016-05-25 DIAGNOSIS — Z955 Presence of coronary angioplasty implant and graft: Secondary | ICD-10-CM | POA: Diagnosis not present

## 2016-05-25 DIAGNOSIS — W19XXXA Unspecified fall, initial encounter: Secondary | ICD-10-CM | POA: Diagnosis present

## 2016-05-25 DIAGNOSIS — Z9981 Dependence on supplemental oxygen: Secondary | ICD-10-CM | POA: Diagnosis not present

## 2016-05-25 DIAGNOSIS — S72002A Fracture of unspecified part of neck of left femur, initial encounter for closed fracture: Secondary | ICD-10-CM | POA: Diagnosis present

## 2016-05-25 DIAGNOSIS — Z7982 Long term (current) use of aspirin: Secondary | ICD-10-CM | POA: Diagnosis not present

## 2016-05-25 DIAGNOSIS — E039 Hypothyroidism, unspecified: Secondary | ICD-10-CM | POA: Diagnosis present

## 2016-05-25 DIAGNOSIS — Z66 Do not resuscitate: Secondary | ICD-10-CM | POA: Diagnosis present

## 2016-05-25 DIAGNOSIS — E785 Hyperlipidemia, unspecified: Secondary | ICD-10-CM | POA: Diagnosis present

## 2016-05-25 DIAGNOSIS — I952 Hypotension due to drugs: Secondary | ICD-10-CM | POA: Diagnosis present

## 2016-05-25 DIAGNOSIS — J9610 Chronic respiratory failure, unspecified whether with hypoxia or hypercapnia: Secondary | ICD-10-CM | POA: Diagnosis present

## 2016-05-25 DIAGNOSIS — I509 Heart failure, unspecified: Secondary | ICD-10-CM | POA: Diagnosis present

## 2016-05-25 DIAGNOSIS — H9190 Unspecified hearing loss, unspecified ear: Secondary | ICD-10-CM | POA: Diagnosis present

## 2016-05-25 DIAGNOSIS — J449 Chronic obstructive pulmonary disease, unspecified: Secondary | ICD-10-CM | POA: Diagnosis present

## 2016-05-25 DIAGNOSIS — Z96641 Presence of right artificial hip joint: Secondary | ICD-10-CM | POA: Diagnosis present

## 2016-05-25 HISTORY — PX: HIP ARTHROPLASTY: SHX981

## 2016-05-25 LAB — BASIC METABOLIC PANEL
Anion gap: 5 (ref 5–15)
BUN: 19 mg/dL (ref 6–20)
CHLORIDE: 107 mmol/L (ref 101–111)
CO2: 27 mmol/L (ref 22–32)
CREATININE: 1.22 mg/dL — AB (ref 0.44–1.00)
Calcium: 8.3 mg/dL — ABNORMAL LOW (ref 8.9–10.3)
GFR calc non Af Amer: 35 mL/min — ABNORMAL LOW (ref >60)
GFR, EST AFRICAN AMERICAN: 41 mL/min — AB (ref >60)
Glucose, Bld: 140 mg/dL — ABNORMAL HIGH (ref 65–99)
POTASSIUM: 3.9 mmol/L (ref 3.5–5.1)
SODIUM: 139 mmol/L (ref 135–145)

## 2016-05-25 LAB — CBC
HEMATOCRIT: 32.3 % — AB (ref 35.0–47.0)
HEMOGLOBIN: 10.7 g/dL — AB (ref 12.0–16.0)
MCH: 29.5 pg (ref 26.0–34.0)
MCHC: 33.2 g/dL (ref 32.0–36.0)
MCV: 88.7 fL (ref 80.0–100.0)
Platelets: 254 10*3/uL (ref 150–440)
RBC: 3.65 MIL/uL — AB (ref 3.80–5.20)
RDW: 14.8 % — ABNORMAL HIGH (ref 11.5–14.5)
WBC: 14.7 10*3/uL — ABNORMAL HIGH (ref 3.6–11.0)

## 2016-05-25 LAB — APTT: APTT: 32 s (ref 24–36)

## 2016-05-25 LAB — SURGICAL PCR SCREEN
MRSA, PCR: POSITIVE — AB
STAPHYLOCOCCUS AUREUS: POSITIVE — AB

## 2016-05-25 SURGERY — HEMIARTHROPLASTY, HIP, DIRECT ANTERIOR APPROACH, FOR FRACTURE
Anesthesia: Spinal | Laterality: Left

## 2016-05-25 MED ORDER — FENTANYL CITRATE (PF) 100 MCG/2ML IJ SOLN: 25.0000 ug | INTRAMUSCULAR | Status: DC | PRN

## 2016-05-25 MED ORDER — BUPIVACAINE LIPOSOME 1.3 % IJ SUSP
INTRAMUSCULAR | Status: AC
  Filled 2016-05-25: qty 20

## 2016-05-25 MED ORDER — PROPOFOL 500 MG/50ML IV EMUL
INTRAVENOUS | Status: DC | PRN
  Administered 2016-05-25: 25 ug/kg/min via INTRAVENOUS

## 2016-05-25 MED ORDER — BUPIVACAINE-EPINEPHRINE (PF) 0.25% -1:200000 IJ SOLN
INTRAMUSCULAR | Status: DC | PRN
  Administered 2016-05-25: 30 mL

## 2016-05-25 MED ORDER — MENTHOL 3 MG MT LOZG: 1.0000 | LOZENGE | OROMUCOSAL | Status: DC | PRN

## 2016-05-25 MED ORDER — MUPIROCIN 2 % EX OINT
1.0000 "application " | TOPICAL_OINTMENT | Freq: Two times a day (BID) | CUTANEOUS | Status: DC
  Administered 2016-05-25 – 2016-05-28 (×7): 1 via NASAL
  Filled 2016-05-25: qty 22

## 2016-05-25 MED ORDER — ONDANSETRON HCL 4 MG/2ML IJ SOLN
INTRAMUSCULAR | Status: DC | PRN
  Administered 2016-05-25: 4 mg via INTRAVENOUS

## 2016-05-25 MED ORDER — PHENYLEPHRINE HCL 10 MG/ML IJ SOLN
INTRAMUSCULAR | Status: DC | PRN
  Administered 2016-05-25: 100 ug via INTRAVENOUS

## 2016-05-25 MED ORDER — ONDANSETRON HCL 4 MG/2ML IJ SOLN: 4.0000 mg | Freq: Four times a day (QID) | INTRAMUSCULAR | Status: DC | PRN

## 2016-05-25 MED ORDER — ENOXAPARIN SODIUM 30 MG/0.3ML ~~LOC~~ SOLN
30.0000 mg | SUBCUTANEOUS | Status: DC
  Administered 2016-05-26 – 2016-05-27 (×2): 30 mg via SUBCUTANEOUS
  Filled 2016-05-25 (×4): qty 0.3

## 2016-05-25 MED ORDER — ONDANSETRON HCL 4 MG/2ML IJ SOLN: 4.0000 mg | Freq: Once | INTRAMUSCULAR | Status: DC | PRN

## 2016-05-25 MED ORDER — BUPIVACAINE-EPINEPHRINE (PF) 0.25% -1:200000 IJ SOLN
INTRAMUSCULAR | Status: AC
Start: 2016-05-25 — End: 2016-05-25
  Filled 2016-05-25: qty 60

## 2016-05-25 MED ORDER — METOCLOPRAMIDE HCL 5 MG/ML IJ SOLN
5.0000 mg | Freq: Three times a day (TID) | INTRAMUSCULAR | Status: DC | PRN
Start: 2016-05-25 — End: 2016-05-28

## 2016-05-25 MED ORDER — ACETAMINOPHEN 325 MG PO TABS: 650.0000 mg | ORAL_TABLET | Freq: Four times a day (QID) | ORAL | Status: DC | PRN

## 2016-05-25 MED ORDER — VANCOMYCIN HCL 10 G IV SOLR
1500.0000 mg | Freq: Once | INTRAVENOUS | Status: AC
  Administered 2016-05-25: 1500 mg via INTRAVENOUS
  Filled 2016-05-25: qty 1500

## 2016-05-25 MED ORDER — NEOMYCIN-POLYMYXIN B GU 40-200000 IR SOLN
Status: AC
  Filled 2016-05-25: qty 20

## 2016-05-25 MED ORDER — SODIUM CHLORIDE 0.45 % IV SOLN
INTRAVENOUS | Status: DC
  Administered 2016-05-25 – 2016-05-26 (×2): via INTRAVENOUS

## 2016-05-25 MED ORDER — VANCOMYCIN HCL IN DEXTROSE 1-5 GM/200ML-% IV SOLN
1000.0000 mg | Freq: Two times a day (BID) | INTRAVENOUS | Status: AC
  Administered 2016-05-25 – 2016-05-26 (×2): 1000 mg via INTRAVENOUS
  Filled 2016-05-25 (×3): qty 200

## 2016-05-25 MED ORDER — NEOMYCIN-POLYMYXIN B GU 40-200000 IR SOLN
Status: DC | PRN
  Administered 2016-05-25: 8 mL

## 2016-05-25 MED ORDER — ONDANSETRON HCL 4 MG PO TABS: 4.0000 mg | ORAL_TABLET | Freq: Four times a day (QID) | ORAL | Status: DC | PRN

## 2016-05-25 MED ORDER — MIDAZOLAM HCL 5 MG/5ML IJ SOLN
INTRAMUSCULAR | Status: DC | PRN
  Administered 2016-05-25 (×2): 1 mg via INTRAVENOUS

## 2016-05-25 MED ORDER — EPHEDRINE SULFATE 50 MG/ML IJ SOLN
INTRAMUSCULAR | Status: DC | PRN
  Administered 2016-05-25: 5 mg via INTRAVENOUS

## 2016-05-25 MED ORDER — METOCLOPRAMIDE HCL 10 MG PO TABS: 5.0000 mg | ORAL_TABLET | Freq: Three times a day (TID) | ORAL | Status: DC | PRN

## 2016-05-25 MED ORDER — BISACODYL 10 MG RE SUPP
10.0000 mg | Freq: Every day | RECTAL | Status: DC | PRN
  Administered 2016-05-27: 10 mg via RECTAL
  Filled 2016-05-25: qty 1

## 2016-05-25 MED ORDER — FERROUS SULFATE 325 (65 FE) MG PO TABS
325.0000 mg | ORAL_TABLET | Freq: Every day | ORAL | Status: DC
  Administered 2016-05-26 – 2016-05-28 (×3): 325 mg via ORAL
  Filled 2016-05-25 (×3): qty 1

## 2016-05-25 MED ORDER — FLEET ENEMA 7-19 GM/118ML RE ENEM
1.0000 | ENEMA | Freq: Once | RECTAL | Status: AC | PRN
  Administered 2016-05-27: 1 via RECTAL

## 2016-05-25 MED ORDER — KETAMINE HCL 50 MG/ML IJ SOLN
INTRAMUSCULAR | Status: DC | PRN
  Administered 2016-05-25 (×2): 25 mg via INTRAMUSCULAR

## 2016-05-25 MED ORDER — ACETAMINOPHEN 650 MG RE SUPP: 650.0000 mg | Freq: Four times a day (QID) | RECTAL | Status: DC | PRN

## 2016-05-25 MED ORDER — MORPHINE SULFATE (PF) 2 MG/ML IV SOLN
1.0000 mg | INTRAVENOUS | Status: DC | PRN
  Administered 2016-05-25 – 2016-05-26 (×2): 1 mg via INTRAVENOUS
  Filled 2016-05-25 (×2): qty 1

## 2016-05-25 MED ORDER — CHLORHEXIDINE GLUCONATE CLOTH 2 % EX PADS
6.0000 | MEDICATED_PAD | Freq: Every day | CUTANEOUS | Status: DC
  Administered 2016-05-25 – 2016-05-28 (×4): 6 via TOPICAL

## 2016-05-25 MED ORDER — PHENOL 1.4 % MT LIQD: 1.0000 | OROMUCOSAL | Status: DC | PRN

## 2016-05-25 MED ORDER — SODIUM CHLORIDE 0.9 % IJ SOLN
INTRAMUSCULAR | Status: AC
  Filled 2016-05-25: qty 50

## 2016-05-25 MED ORDER — ALUM & MAG HYDROXIDE-SIMETH 200-200-20 MG/5ML PO SUSP: 30.0000 mL | ORAL | Status: DC | PRN

## 2016-05-25 SURGICAL SUPPLY — 54 items
BAG COUNTER SPONGE EZ (MISCELLANEOUS) IMPLANT
BLADE DEBAKEY 8.0 (BLADE) ×2 IMPLANT
BLADE DEBAKEY 8.0MM (BLADE) ×1
BLADE SAGITTAL WIDE XTHICK NO (BLADE) ×3 IMPLANT
BLADE SURG SZ10 CARB STEEL (BLADE) ×3 IMPLANT
CANISTER SUCT 1200ML W/VALVE (MISCELLANEOUS) ×9 IMPLANT
CAPT HIP HEMI 2 ×3 IMPLANT
CHLORAPREP W/TINT 26ML (MISCELLANEOUS) ×6 IMPLANT
COUNTER SPONGE BAG EZ (MISCELLANEOUS)
DRAPE INCISE IOBAN 66X60 STRL (DRAPES) ×3 IMPLANT
DRAPE TABLE BACK 80X90 (DRAPES) ×3 IMPLANT
DRSG AQUACEL AG ADV 3.5X10 (GAUZE/BANDAGES/DRESSINGS) ×3 IMPLANT
DRSG AQUACEL AG ADV 3.5X14 (GAUZE/BANDAGES/DRESSINGS) IMPLANT
ELECT BLADE 6.5 EXT (BLADE) IMPLANT
ELECT CAUTERY BLADE 6.4 (BLADE) ×3 IMPLANT
ELECT REM PT RETURN 9FT ADLT (ELECTROSURGICAL) ×3
ELECTRODE REM PT RTRN 9FT ADLT (ELECTROSURGICAL) ×1 IMPLANT
GAUZE PETRO XEROFOAM 1X8 (MISCELLANEOUS) ×6 IMPLANT
GAUZE SPONGE 4X4 12PLY STRL (GAUZE/BANDAGES/DRESSINGS) ×3 IMPLANT
GLOVE BIO SURGEON STRL SZ7 (GLOVE) ×6 IMPLANT
GLOVE INDICATOR 7.5 STRL GRN (GLOVE) ×6 IMPLANT
GLOVE INDICATOR 8.0 STRL GRN (GLOVE) ×9 IMPLANT
GLOVE SURG ORTHO 8.5 STRL (GLOVE) ×3 IMPLANT
GOWN STRL REUS W/ TWL LRG LVL3 (GOWN DISPOSABLE) ×3 IMPLANT
GOWN STRL REUS W/TWL LRG LVL3 (GOWN DISPOSABLE) ×6
HANDPIECE SUCTION TUBG SURGILV (MISCELLANEOUS) ×3 IMPLANT
HEMOVAC 400CC 10FR (MISCELLANEOUS) ×3 IMPLANT
HIP CAPITATED HEMI 2 ×1 IMPLANT
IV NS 1000ML (IV SOLUTION) ×2
IV NS 1000ML BAXH (IV SOLUTION) ×1 IMPLANT
KIT RM TURNOVER STRD PROC AR (KITS) ×3 IMPLANT
NDL SAFETY ECLIPSE 18X1.5 (NEEDLE) ×2 IMPLANT
NEEDLE FILTER BLUNT 18X 1/2SAF (NEEDLE) ×2
NEEDLE FILTER BLUNT 18X1 1/2 (NEEDLE) ×1 IMPLANT
NEEDLE HYPO 18GX1.5 SHARP (NEEDLE) ×4
NEEDLE MAYO CATGUT SZ4 (NEEDLE) ×3 IMPLANT
NEEDLE SPNL 18GX3.5 QUINCKE PK (NEEDLE) ×6 IMPLANT
NS IRRIG 1000ML POUR BTL (IV SOLUTION) ×3 IMPLANT
PACK HIP PROSTHESIS (MISCELLANEOUS) ×3 IMPLANT
PAD ABD DERMACEA PRESS 5X9 (GAUZE/BANDAGES/DRESSINGS) ×6 IMPLANT
SOL PREP PVP 2OZ (MISCELLANEOUS) ×3
SOLUTION PREP PVP 2OZ (MISCELLANEOUS) ×1 IMPLANT
STAPLER SKIN PROX 35W (STAPLE) ×3 IMPLANT
SUT DVC 2 QUILL PDO  T11 36X36 (SUTURE) ×4
SUT DVC 2 QUILL PDO T11 36X36 (SUTURE) ×2 IMPLANT
SUT QUILL PDO 0 36 36 VIOLET (SUTURE) ×3 IMPLANT
SUT TICRON 2-0 30IN 311381 (SUTURE) ×15 IMPLANT
SYR 30ML LL (SYRINGE) ×3 IMPLANT
SYR 50ML LL SCALE MARK (SYRINGE) IMPLANT
SYRINGE 10CC LL (SYRINGE) ×6 IMPLANT
TAPE MICROFOAM 4IN (TAPE) ×3 IMPLANT
TIP COAXIAL FEMORAL CANAL (MISCELLANEOUS) IMPLANT
TUBE SUCT KAM VAC (TUBING) ×3 IMPLANT
WATER STERILE IRR 1000ML POUR (IV SOLUTION) IMPLANT

## 2016-05-25 NOTE — Consult Note (Signed)
ORTHOPAEDIC CONSULTATION  REQUESTING PHYSICIAN: Earnestine Leys, MD  Chief Complaint: Left hip pain  HPI: Kristen Mcdonald is a 82 y.o. female who complains of  Left hip pain after falling outside last night. Brought to ER where exam and x-rays show a displaced left femoral neck fracture. She had similar injury 4 years ago.  Have discussed treatment with patient and family and recommend hemiarthroplasty.  Risks and benefits discussed at length.   Past Medical History  Diagnosis Date  . CHF (congestive heart failure) ([place])   . Thyroid disease   . GERD (gastroesophageal reflux disease)   . Anemia   . COPD (chronic obstructive pulmonary disease) ([place])   . Arthritis   . Heart attack ([place])   . Cancer [facility])    Past Surgical History  Procedure Laterality Date  . Vesicovaginal fistula closure w/ tah    . Mastectomy    . Abdominal hysterectomy    . Appendectomy    . Coronary angioplasty with stent placement    . Right hip surgery    . Abdominal blockage     Social History   Social History  . Marital Status: Widowed    Spouse Name: N/A  . Number of Children: N/A  . Years of Education: N/A   Social History Main Topics  . Smoking status: Never Smoker   . Smokeless tobacco: None  . Alcohol Use: No  . Drug Use: None  . Sexual Activity: Not Asked   Other Topics Concern  . None   Social History Narrative   Family History  Problem Relation Age of Onset  . Stroke Mother   . Cancer Father    No Known Allergies Prior to Admission medications   Medication Sig Start Date End Date Taking? Authorizing Provider  amiodarone (PACERONE) 200 MG tablet Take 200 mg by mouth daily.   Yes Historical Provider, MD  aspirin EC 81 MG tablet Take 81 mg by mouth daily.   Yes Historical Provider, MD  atorvastatin (LIPITOR) 10 MG tablet Take 10 mg by mouth daily.   Yes Historical Provider, MD  calcium-vitamin D (OSCAL WITH D) 500-200 MG-UNIT tablet Take 1 tablet by mouth 2 (two) times daily.    Yes Historical Provider, MD  furosemide (LASIX) 40 MG tablet Take 40 mg by mouth daily. Pt is able to take an additional tablet daily if needed for edema.   Yes Historical Provider, MD  guaiFENesin (ROBITUSSIN) 100 MG/5ML SOLN Take 5 mLs (100 mg total) by mouth every 4 (four) hours as needed for cough or to loosen phlegm. 02/08/16  Yes Dustin Flock, MD  levothyroxine (SYNTHROID, LEVOTHROID) 88 MCG tablet Take 88 mcg by mouth daily before breakfast.   Yes Historical Provider, MD  Multiple Vitamin (MULTIVITAMIN WITH MINERALS) TABS tablet Take 1 tablet by mouth daily.   Yes Historical Provider, MD  potassium chloride (K-DUR,KLOR-CON) 10 MEQ tablet Take 10 mEq by mouth daily.   Yes Historical Provider, MD  ranitidine (ZANTAC) 150 MG tablet Take 150 mg by mouth 2 (two) times daily.   Yes Historical Provider, MD  solifenacin (VESICARE) 5 MG tablet Take 5 mg by mouth daily.   Yes Historical Provider, MD   Dg Chest 1 View  05/24/2016  CLINICAL DATA:  Recent fall with left hip fracture, initial encounter EXAM: CHEST 1 VIEW COMPARISON:  02/07/2016 FINDINGS: Cardiac shadow is enlarged but stable. Diffuse aortic calcifications are again seen. Scattered interstitial changes are noted bilaterally without focal infiltrate or sizable effusion. No  acute bony abnormality is noted. IMPRESSION: Chronic changes without acute abnormality. Aortic atherosclerotic disease. Electronically Signed   By: Inez Catalina M.D.   On: 05/24/2016 18:26   Ct Head Wo Contrast  05/24/2016  CLINICAL DATA:  Recent fall with known left hip fracture and closed head injury, initial encounter EXAM: CT HEAD WITHOUT CONTRAST CT CERVICAL SPINE WITHOUT CONTRAST TECHNIQUE: Multidetector CT imaging of the head and cervical spine was performed following the standard protocol without intravenous contrast. Multiplanar CT image reconstructions of the cervical spine were also generated. COMPARISON:  01/03/2007 FINDINGS: CT HEAD FINDINGS Bony calvarium is  intact. Diffuse atrophic changes and chronic white matter ischemic changes are noted. No findings to suggest acute hemorrhage, acute infarction or space-occupying mass lesion are seen. CT CERVICAL SPINE FINDINGS Seven cervical segments are well visualized. Disc space narrowing and facet hypertrophic changes are noted at multiple levels. No findings to suggest acute fracture or acute facet abnormality are seen. Surrounding soft tissues demonstrate diffuse carotid calcifications. Multiple thyroid nodules are noted. Most prominent of these lies on the right and measures approximately 2 cm in greatest dimension. Visualized lung apices demonstrate mild changes of COPD. IMPRESSION: CT of the head: Chronic atrophic and ischemic changes without acute abnormality. CT of the cervical spine: Multilevel degenerative change without acute abnormality. Right thyroid nodule measuring over 2 cm. This was present on a prior CT of the chest from 20/11 and likely benign in etiology given the lack of change. Electronically Signed   By: Inez Catalina M.D.   On: 05/24/2016 18:43   Ct Cervical Spine Wo Contrast  05/24/2016  CLINICAL DATA:  Recent fall with known left hip fracture and closed head injury, initial encounter EXAM: CT HEAD WITHOUT CONTRAST CT CERVICAL SPINE WITHOUT CONTRAST TECHNIQUE: Multidetector CT imaging of the head and cervical spine was performed following the standard protocol without intravenous contrast. Multiplanar CT image reconstructions of the cervical spine were also generated. COMPARISON:  01/03/2007 FINDINGS: CT HEAD FINDINGS Bony calvarium is intact. Diffuse atrophic changes and chronic white matter ischemic changes are noted. No findings to suggest acute hemorrhage, acute infarction or space-occupying mass lesion are seen. CT CERVICAL SPINE FINDINGS Seven cervical segments are well visualized. Disc space narrowing and facet hypertrophic changes are noted at multiple levels. No findings to suggest acute  fracture or acute facet abnormality are seen. Surrounding soft tissues demonstrate diffuse carotid calcifications. Multiple thyroid nodules are noted. Most prominent of these lies on the right and measures approximately 2 cm in greatest dimension. Visualized lung apices demonstrate mild changes of COPD. IMPRESSION: CT of the head: Chronic atrophic and ischemic changes without acute abnormality. CT of the cervical spine: Multilevel degenerative change without acute abnormality. Right thyroid nodule measuring over 2 cm. This was present on a prior CT of the chest from 20/11 and likely benign in etiology given the lack of change. Electronically Signed   By: Inez Catalina M.D.   On: 05/24/2016 18:43   Dg Hip Unilat With Pelvis 2-3 Views Left  05/24/2016  CLINICAL DATA:  Recent fall left hip pain, initial encounter EXAM: DG HIP (WITH OR WITHOUT PELVIS) 2-3V LEFT COMPARISON:  None. FINDINGS: There is evidence of subcapital femoral neck fracture with impaction and angulation at the fracture site. Prior right hip prosthesis is noted. The pelvic ring is intact. Lumbar degenerative changes are seen. IMPRESSION: Proximal left femoral fracture as described. Electronically Signed   By: Inez Catalina M.D.   On: 05/24/2016 18:26    Positive  ROS: All other systems have been reviewed and were otherwise negative with the exception of those mentioned in the HPI and as above.  Physical Exam: General: Alert, no acute distress Cardiovascular: No pedal edema Respiratory: No cyanosis, no use of accessory musculature GI: No organomegaly, abdomen is soft and non-tender Skin: No lesions in the area of chief complaint Neurologic: Sensation intact distally Psychiatric: Patient is competent for consent with normal mood and affect Lymphatic: No axillary or cervical lymphadenopathy  MUSCULOSKELETAL: Left leg short and rotated.  Pain with motion.  CSM good distally.  Skin intact.  No other injuries noted.   Assessment: Left  femoral neck fracture  Plan: Left hip hemiarthroplasty.  Cleared by medical service.      [place] Breed, MD [phone]   05/25/2016 10:41 AM

## 2016-05-25 NOTE — Clinical Social Work Placement (Signed)
   CLINICAL SOCIAL WORK PLACEMENT  NOTE  Date:  05/25/2016  Patient Details  Name: Kristen Mcdonald MRN: [id number] Date of Birth: Feb 27, 1913  Clinical Social Work is seeking post-discharge placement for this patient at the [place] level of care (*CSW will initial, date and re-position this form in  chart as items are completed):  Yes   Patient/family provided with [facility] Work Department's list of facilities offering this level of care within the geographic area requested by the patient (or if unable, by the patient's family).  Yes   Patient/family informed of their freedom to choose among providers that offer the needed level of care, that participate in Medicare, Medicaid or managed care program needed by the patient, have an available bed and are willing to accept the patient.  Yes   Patient/family informed of [facility]'s ownership interest in [facility] and [facility], as well as of the fact that they are under no obligation to receive care at these facilities.  PASRR submitted to EDS on       PASRR number received on       Existing PASRR number confirmed on 05/25/16     FL2 transmitted to all facilities in geographic area requested by pt/family on 05/25/16     FL2 transmitted to all facilities within larger geographic area on       Patient informed that his/her managed care company has contracts with or will negotiate with certain facilities, including the following:            Patient/family informed of bed offers received.  Patient chooses bed at       Physician recommends and patient chooses bed at      Patient to be transferred to   on  .  Patient to be transferred to facility by       Patient family notified on   of transfer.  Name of family member notified:        PHYSICIAN       Additional Comment:    _______________________________________________ Loralyn Freshwater, LCSW 05/25/2016, 9:20 AM

## 2016-05-25 NOTE — Care Management Note (Signed)
Case Management Note  Patient Details  Name: MACIEL KEGG MRN: [id number] Date of Birth: 05/01/1913  Subjective/Objective:                  Met with niece Pamala Hurry Cheek/[facility] to discuss discharge planning- patient was sleeping. Patient for surgery today with Dr. Jonny Ruiz. She is from home alone. She ambulates with walker. Pamala Hurry provides transportation to appointments. They wants [facility] for rehab at discharge. Her PCP is Dr. Humphrey Rolls. She uses [facility] for Rx.  Action/Plan: List of home health agencies provided. [facility] has used [facility] in the past and was happy. RNCM will continue to follow.   Expected Discharge Date:                  Expected Discharge Plan:     In-House Referral:  Clinical Social Work  Discharge planning Services  CM Consult  Post Acute Care Choice:  Home Health Choice offered to:  Adult Children  DME Arranged:    DME Agency:     HH Arranged:    HH Agency:  [facility]  Status of Service:  In process, will continue to follow  If discussed at Long Length of Stay Meetings, dates discussed:    Additional Comments:  Marshell Garfinkel, RN 05/25/2016, 11:28 AM

## 2016-05-25 NOTE — Anesthesia Postprocedure Evaluation (Signed)
Anesthesia Post Note  Patient: Kristen Mcdonald  Procedure(s) Performed: Procedure(s) (LRB): ARTHROPLASTY BIPOLAR HIP (HEMIARTHROPLASTY) (Left)  Patient location during evaluation: PACU Anesthesia Type: General Level of consciousness: awake and alert Pain management: pain level controlled Vital Signs Assessment: post-procedure vital signs reviewed and stable Respiratory status: spontaneous breathing, nonlabored ventilation, respiratory function stable and patient connected to nasal cannula oxygen Cardiovascular status: blood pressure returned to baseline and stable Postop Assessment: no signs of nausea or vomiting Anesthetic complications: no    Last Vitals:  Filed Vitals:   05/25/16 1659 05/25/16 1755  BP: 134/68 120/60  Pulse: 60 58  Temp: 36.6 C 37.1 C  Resp: 16 18    Last Pain:  Filed Vitals:   05/25/16 1755  PainSc: 0-No pain                 [name] S

## 2016-05-25 NOTE — ED Notes (Signed)
Pt resting quietly with eyes closed, no distress, resp even/unlab; cont to await admission; inquired with charge nurse and ED secretary regarding admission status and discovered pt still awaiting an admission disposition order to be placed; order entered by this nurse per the admitting physician's miscellaneous order entry placed at 7pm

## 2016-05-25 NOTE — H&P (Signed)
THE PATIENT WAS SEEN PRIOR TO SURGERY TODAY.  HISTORY, ALLERGIES, HOME MEDICATIONS AND OPERATIVE PROCEDURE WERE REVIEWED. RISKS AND BENEFITS OF SURGERY DISCUSSED WITH PATIENT AGAIN.  NO CHANGES FROM INITIAL HISTORY AND PHYSICAL NOTED.    

## 2016-05-25 NOTE — Op Note (Signed)
05/24/2016 - 05/25/2016  2:10 PM  PATIENT:  Kristen Mcdonald   MRN: [id number]  PRE-OPERATIVE DIAGNOSIS:  fractured left hip displaced femoral neck  POST-OPERATIVE DIAGNOSIS:  left hip fracture  PROCEDURE:  Procedure(s): ARTHROPLASTY UNIIPOLAR HIP (HEMIARTHROPLASTY) ACCOLADE  PREOPERATIVE INDICATIONS:  Kristen Mcdonald is an 82 y.o. female who was admitted 05/24/2016 with a diagnosis of <principal problem not specified> and elected for surgical management.  The risks benefits and alternatives were discussed with the patient including but not limited to the risks of nonoperative treatment, versus surgical intervention including infection, bleeding, nerve injury, periprosthetic fracture, the need for revision surgery, dislocation, leg length discrepancy, blood clots, cardiopulmonary complications, morbidity, mortality, among others, and they were willing to proceed.  Predicted outcome is good, although there will be at least a six to nine month expected recovery.     SURGEON:  Earnestine Leys, MD    ANESTHESIA: Spinal    COMPLICATIONS:  None.   EBL:  150 CC    COMPONENTS:  Stryker Accolade Femoral Fracture stem size   #4  ,   and a size   46 mm  fracture head unipolar hip ball with    +4   neck length.    PROCEDURE IN DETAIL: The patient was met in the holding area and identified.  The appropriate hip  was marked at the operative site. The patient was then transported to the OR and  placed under general anesthesia.  At that point, the patient was  placed in the lateral decubitus position with the operative side up and  secured to the operating room table and all bony prominences padded.     The operative lower extremity was prepped from the iliac crest to the toes.  Sterile draping was performed.  Time out was performed prior to incision.      A routine posterolateral approach was utilized via sharp dissection  carried down to the subcutaneous tissue.  Gross bleeders were Bovie  coagulated.   The iliotibial band was identified and incised  along the length of the skin incision.  Self-retaining retractors were  inserted.  With the hip internally rotated, the short external rotators  were identified. The piriformis was tagged and the hip capsule released in a T-type fashion.  The femoral neck was exposed, and I resected the femoral neck using the appropriate jig. This was performed at approximately a thumb's breadth above the lesser trochanter.    I then exposed the deep acetabulum, cleared out any tissue including the ligamentum teres.    I then prepared the proximal femur using the cookie-cutter, the lateralizing reamer, and then sequentially broached.  A trial stem size #4   was  utilized along with a   46  mm unipolar head with   +4   neck and I reduced the hip and it was found to have excellent stability with functional range of motion. Leg lengths were equal.  The trial components were then removed.   The same size Accolade femoral stem was then inserted and was very stable.  The Unitrax head and neck as trialed were inserted as well.     The hip was then reduced and taken through functional range of motion and found to have excellent stability. Leg lengths were restored.    I closed the T in the capsule with #2 Ticron as well as the short external rotators. A hemovac was inserted.    I then irrigated the hip copiously again with pulse lavage,  and repaired the fascia with #2 Quill and the subcutaneous layer with #0 Quill. Sponge and needle counts were correct.   Dry sterile Aquacell was applied.   The patient was then awakened and returned to PACU in stable and satisfactory condition. There were no complications.  Park Breed, MD Orthopedic Surgeon [phone]   05/25/2016 2:10 PM

## 2016-05-25 NOTE — Clinical Social Work Note (Signed)
Clinical Social Work Assessment  Patient Details  Name: Kristen Mcdonald MRN: [id number] Date of Birth: 1913/02/25  Date of referral:  05/25/16               Reason for consult:  Facility Placement                Permission sought to share information with:  Chartered certified accountant granted to share information::  Yes, Verbal Permission Granted  Name::      [name]::   [name]   Relationship::     Contact Information:     Housing/Transportation Living arrangements for the past 2 months:  [name] of Information:  Patient, Power of Attorney Patient Interpreter Needed:  None Criminal Activity/Legal Involvement Pertinent to Current Situation/Hospitalization:  No - Comment as needed Significant Relationships:  Other Family Members Lives with:  Self Do you feel safe going back to the place where you live?  Yes Need for family participation in patient care:  Yes (Comment)  Care giving concerns:  Patient lives in [place] alone.    Social Worker assessment / plan:  Holiday representative ([place]) met with patient and her niece/ [facility] [place] Sanes (home [phone]) was at bedside. Patient fell at home and sustainted a hip fracture and surgery is pending. CSW introduced self and explained role of CSW department. Patient is very hard of hearing. Per niece patient lives alone in [place] and niece lives in [place]. [place] explained that PT will evaluate patient after surgery and make a recommendation of home health or SNF. Per niece patient will have to go to SNF because she lives alone. Per niece patient has fallen and broke her other hip a few years ago and went to [facility] for rehab. Niece is agreeable to SNF search and prefers [facility]. CSW also discussed long term care options with niece. Per niece patient may not be able to return home after rehab. CSW explained private pay and Medicaid process. Per  niece patient cannot pay privately for long term care. CSW gave patient phone numbers to Medicaid workers.   FL2 complete and faxed out. CSW will continue to follow and assist as needed.   Employment status:  Retired Nurse, adult PT Recommendations:  Not assessed at this time Information / Referral to community resources:  [place]  Patient/Family's Response to care:  Patient and niece are agreeable to SNF search and prefer [facility].   Patient/Family's Understanding of and Emotional Response to Diagnosis, Current Treatment, and Prognosis:  Patient and niece were pleasant and thanked CSW for visit.    Emotional Assessment Appearance:  Appears stated age Attitude/Demeanor/Rapport:    Affect (typically observed):  Accepting, Adaptable, Pleasant Orientation:  Oriented to Self, Oriented to Place, Oriented to  Time, Oriented to Situation Alcohol / Substance use:  Not Applicable Psych involvement (Current and /or in the community):  No (Comment)  Discharge Needs  Concerns to be addressed:  Discharge Planning Concerns Readmission within the last 30 days:  No Current discharge risk:  Dependent with Mobility Barriers to Discharge:  Continued Medical Work up   Loralyn Freshwater, LCSW 05/25/2016, 9:22 AM

## 2016-05-25 NOTE — Transfer of Care (Signed)
Immediate Anesthesia Transfer of Care Note  Patient: Kristen Mcdonald  Procedure(s) Performed: Procedure(s): ARTHROPLASTY BIPOLAR HIP (HEMIARTHROPLASTY) (Left)  Patient Location: PACU  Anesthesia Type:Spinal  Level of Consciousness: sedated  Airway & Oxygen Therapy: Patient Spontanous Breathing and Patient connected to face mask oxygen  Post-op Assessment: Report given to RN and Post -op Vital signs reviewed and stable  Post vital signs: Reviewed and stable  Last Vitals:  Filed Vitals:   05/25/16 0743 05/25/16 1130  BP: 143/73 131/60  Pulse: 59 60  Temp: 37.6 C 37.1 C  Resp: 20 20    Last Pain:  Filed Vitals:   05/25/16 1133  PainSc: 3          Complications: No apparent anesthesia complications

## 2016-05-25 NOTE — NC FL2 (Signed)
[place] LEVEL OF CARE SCREENING TOOL     IDENTIFICATION  Patient Name: Kristen Mcdonald Birthdate: [birth year] Sex: female Admission Date (Current Location): 05/24/2016  [place] and [place] Number:  Engineering geologist and Address:  [facility], [address], [place], [place] [address]      Provider Number: [id number]  Attending Physician Name and Address:  Earnestine Leys, MD  Relative Name and Phone Number:       Current Level of Care: Hospital Recommended Level of Care: [place] Prior Approval Number:    Date Approved/Denied:   PASRR Number:  ([id number])  Discharge Plan: SNF    Current Diagnoses: Patient Active Problem List   Diagnosis Date Noted  . Hip fracture ([place]) 05/25/2016  . Pneumonia 02/04/2016    Orientation RESPIRATION BLADDER Height & Weight     Self, Time, Situation, Place  O2 (4 Liters Oxygen ) Continent Weight: 140 lb 14.4 oz (63.912 kg) Height:     BEHAVIORAL SYMPTOMS/MOOD NEUROLOGICAL BOWEL NUTRITION STATUS   (none )  (none ) Continent Diet (NPO for surgery )  AMBULATORY STATUS COMMUNICATION OF NEEDS Skin   Extensive Assist Verbally Surgical wounds                       Personal Care Assistance Level of Assistance  Bathing, Feeding, Dressing Bathing Assistance: Limited assistance Feeding assistance: Independent Dressing Assistance: Limited assistance     Functional Limitations Info  Sight, Hearing, Speech Sight Info: Adequate Hearing Info: Adequate Speech Info: Adequate    SPECIAL CARE FACTORS FREQUENCY  PT (By licensed PT), OT (By licensed OT)     PT Frequency:  (5) OT Frequency:  (5)            Contractures      Additional Factors Info  Code Status, Allergies, Isolation Precautions Code Status Info:  (Full Code. ) Allergies Info:  (No Known Allergies. )     Isolation Precautions Info:  (MRSA Nasal Swab. )     Current Medications (05/25/2016):  This is  the current hospital active medication list Current Facility-Administered Medications  Medication Dose Route Frequency Provider Last Rate Last Dose  . 0.9 %  sodium chloride infusion   Intravenous Continuous Loletha Grayer, MD 30 mL/hr at 05/25/16 0128    . amiodarone (PACERONE) tablet 200 mg  200 mg Oral Daily Loletha Grayer, MD      . atorvastatin (LIPITOR) tablet 10 mg  10 mg Oral Daily Loletha Grayer, MD      . calcium-vitamin D (OSCAL WITH D) 500-200 MG-UNIT per tablet 1 tablet  1 tablet Oral BID Loletha Grayer, MD      . celecoxib (CELEBREX) capsule 200 mg  200 mg Oral Daily Earnestine Leys, MD      . Chlorhexidine Gluconate Cloth 2 % PADS 6 each  6 each Topical Q0600 Earnestine Leys, MD   6 each at 05/25/16 0800  . famotidine (PEPCID) tablet 20 mg  20 mg Oral Daily Loletha Grayer, MD      . guaiFENesin (ROBITUSSIN) 100 MG/5ML solution 100 mg  5 mL Oral Q4H PRN Loletha Grayer, MD      . HYDROcodone-acetaminophen (NORCO/VICODIN) 5-325 MG per tablet 1-2 tablet  1-2 tablet Oral Q6H PRN Earnestine Leys, MD      . levothyroxine (SYNTHROID, LEVOTHROID) tablet 88 mcg  88 mcg Oral QAC breakfast Loletha Grayer, MD   88 mcg at 05/25/16 0848  . methocarbamol (ROBAXIN)  tablet 500 mg  500 mg Oral Q6H PRN Earnestine Leys, MD       Or  . methocarbamol (ROBAXIN) 500 mg in dextrose 5 % 50 mL IVPB  500 mg Intravenous Q6H PRN Earnestine Leys, MD      . morphine 2 MG/ML injection 1 mg  1 mg Intravenous Q2H PRN Earnestine Leys, MD   1 mg at 05/24/16 2100  . multivitamin with minerals tablet 1 tablet  1 tablet Oral Daily Loletha Grayer, MD      . mupirocin ointment (BACTROBAN) 2 % 1 application  1 application Nasal BID Earnestine Leys, MD   1 application at [id number]  . potassium chloride SA (K-DUR,KLOR-CON) CR tablet 10 mEq  10 mEq Oral Daily Loletha Grayer, MD      . [facility]) tablet 8.6 mg  1 tablet Oral BID Earnestine Leys, MD      . vancomycin (VANCOCIN) 1,500 mg in sodium chloride 0.9 % 500 mL IVPB   1,500 mg Intravenous Once Earnestine Leys, MD      . zolpidem Lorrin Mais) tablet 5 mg  5 mg Oral QHS PRN Earnestine Leys, MD         Discharge Medications: Please see discharge summary for a list of discharge medications.  Relevant Imaging Results:  Relevant Lab Results:   Additional Information  (SSN: [id number])  Loralyn Freshwater, LCSW

## 2016-05-25 NOTE — Progress Notes (Signed)
Dr. Estanislado Pandy notified of pt. Being mrsa positive and pt. Put on isolation.

## 2016-05-25 NOTE — Anesthesia Preprocedure Evaluation (Addendum)
Anesthesia Evaluation  Patient identified by MRN, date of birth, ID band Patient awake    Reviewed: Allergy & Precautions, NPO status , Patient's Chart, lab work & pertinent test results, reviewed documented beta blocker date and time   Airway Mallampati: II  TM Distance: >3 FB     Dental  (+) Chipped, Upper Dentures, Lower Dentures   Pulmonary pneumonia, resolved, COPD,           Cardiovascular + Past MI, + Cardiac Stents and +CHF       Neuro/Psych    GI/Hepatic GERD  Controlled,  Endo/Other    Renal/GU      Musculoskeletal  (+) Arthritis ,   Abdominal   Peds  Hematology  (+) anemia ,   Anesthesia Other Findings Hb 10.7. EKG shows multiple PVCs, otherwise OK. Uses O2 at home. Denies NTG use. Runs a low BP. Has been 58/40. R hip surgery.  Reproductive/Obstetrics                          Anesthesia Physical Anesthesia Plan  ASA: III  Anesthesia Plan: Spinal   Post-op Pain Management:    Induction:   Airway Management Planned:   Additional Equipment:   Intra-op Plan:   Post-operative Plan:   Informed Consent: I have reviewed the patients History and Physical, chart, labs and discussed the procedure including the risks, benefits and alternatives for the proposed anesthesia with the patient or authorized representative who has indicated his/her understanding and acceptance.     Plan Discussed with: CRNA  Anesthesia Plan Comments:         Anesthesia Quick Evaluation

## 2016-05-25 NOTE — Anesthesia Procedure Notes (Addendum)
Date/Time: 05/25/2016 12:17 PM Performed by: Nelda Marseille Pre-anesthesia Checklist: Patient identified, Emergency Drugs available, Suction available, Patient being monitored and Timeout performed Oxygen Delivery Method: Simple face mask   Spinal Patient location during procedure: OR Staffing Anesthesiologist: Gunnar Bulla Performed by: anesthesiologist  Preanesthetic Checklist Completed: patient identified, site marked, surgical consent, pre-op evaluation, timeout performed, IV checked and risks and benefits discussed Spinal Block Patient position: sitting Prep: Betadine Patient monitoring: heart rate, cardiac monitor, continuous pulse ox and blood pressure Approach: midline Location: L3-4 Injection technique: single-shot Needle Needle type: Pencil-Tip  Needle gauge: 25 G Needle length: 9 cm Assessment Sensory level: T10 Additional Notes Marcaine 12mg .

## 2016-05-25 NOTE — Progress Notes (Signed)
Patient ID: Kristen Mcdonald, female   DOB: Nov 13, 1913, 82 y.o.   MRN: [id number] [place] [facility] [place] NAME: Kristen Mcdonald    MR#:  [id number]  DATE OF BIRTH:  [birth year]  SUBJECTIVE:    REVIEW OF SYSTEMS:   ROS Nutrition:  Tolerating PT: Tolerating diet:   DRUG ALLERGIES:  No Known Allergies  VITALS:  Blood pressure 123/66, pulse 55, temperature 97.5 F (36.4 C), temperature source Oral, resp. rate 18, weight 63.912 kg (140 lb 14.4 oz), SpO2 98 %.  PHYSICAL EXAMINATION:  GENERAL:  82 y.o.-year-old patient lying in the bed with no acute distress.  EYES: Pupils equal, round, reactive to light and accommodation. No scleral icterus. Extraocular muscles intact.  HEENT: Head atraumatic, normocephalic. Oropharynx and nasopharynx clear.  NECK:  Supple, no jugular venous distention. No thyroid enlargement, no tenderness.  LUNGS: Normal breath sounds bilaterally, no wheezing, rales,rhonchi or crepitation. No use of accessory muscles of respiration.  CARDIOVASCULAR: S1, S2 normal. No murmurs, rubs, or gallops.  ABDOMEN: Soft, nontender, nondistended. Bowel sounds present. No organomegaly or mass.  EXTREMITIES: No pedal edema, cyanosis, or clubbing.  NEUROLOGIC: Cranial nerves II through XII are intact. Muscle strength 5/5 in all extremities. Sensation intact. Gait not checked.  PSYCHIATRIC: The patient is alert and oriented x 3.  SKIN: No obvious rash, lesion, or ulcer.    LABORATORY PANEL:   CBC  Recent Labs Lab 05/24/16 1752 05/25/16 0345  WBC 7.3 14.7*  HGB 11.4* 10.7*  HCT 34.1* 32.3*  PLT 276 254   ------------------------------------------------------------------------------------------------------------------  Chemistries   Recent Labs Lab 05/24/16 1752 05/25/16 0345  NA 140 139  K 3.6 3.9  CL 105 107  CO2 23 27  GLUCOSE 131* 140*  BUN 18 19  CREATININE 1.16* 1.22*  CALCIUM 9.0 8.3*    ------------------------------------------------------------------------------------------------------------------  Cardiac Enzymes No results for input(s): TROPONINI in the last 168 hours. ------------------------------------------------------------------------------------------------------------------  RADIOLOGY:  Dg Chest 1 View  05/24/2016  CLINICAL DATA:  Recent fall with left hip fracture, initial encounter EXAM: CHEST 1 VIEW COMPARISON:  02/07/2016 FINDINGS: Cardiac shadow is enlarged but stable. Diffuse aortic calcifications are again seen. Scattered interstitial changes are noted bilaterally without focal infiltrate or sizable effusion. No acute bony abnormality is noted. IMPRESSION: Chronic changes without acute abnormality. Aortic atherosclerotic disease. Electronically Signed   By: Inez Catalina M.D.   On: 05/24/2016 18:26   Ct Head Wo Contrast  05/24/2016  CLINICAL DATA:  Recent fall with known left hip fracture and closed head injury, initial encounter EXAM: CT HEAD WITHOUT CONTRAST CT CERVICAL SPINE WITHOUT CONTRAST TECHNIQUE: Multidetector CT imaging of the head and cervical spine was performed following the standard protocol without intravenous contrast. Multiplanar CT image reconstructions of the cervical spine were also generated. COMPARISON:  01/03/2007 FINDINGS: CT HEAD FINDINGS Bony calvarium is intact. Diffuse atrophic changes and chronic white matter ischemic changes are noted. No findings to suggest acute hemorrhage, acute infarction or space-occupying mass lesion are seen. CT CERVICAL SPINE FINDINGS Seven cervical segments are well visualized. Disc space narrowing and facet hypertrophic changes are noted at multiple levels. No findings to suggest acute fracture or acute facet abnormality are seen. Surrounding soft tissues demonstrate diffuse carotid calcifications. Multiple thyroid nodules are noted. Most prominent of these lies on the right and measures approximately 2 cm in  greatest dimension. Visualized lung apices demonstrate mild changes of COPD. IMPRESSION: CT of the head: Chronic atrophic and ischemic changes without acute  abnormality. CT of the cervical spine: Multilevel degenerative change without acute abnormality. Right thyroid nodule measuring over 2 cm. This was present on a prior CT of the chest from 20/11 and likely benign in etiology given the lack of change. Electronically Signed   By: Inez Catalina M.D.   On: 05/24/2016 18:43   Ct Cervical Spine Wo Contrast  05/24/2016  CLINICAL DATA:  Recent fall with known left hip fracture and closed head injury, initial encounter EXAM: CT HEAD WITHOUT CONTRAST CT CERVICAL SPINE WITHOUT CONTRAST TECHNIQUE: Multidetector CT imaging of the head and cervical spine was performed following the standard protocol without intravenous contrast. Multiplanar CT image reconstructions of the cervical spine were also generated. COMPARISON:  01/03/2007 FINDINGS: CT HEAD FINDINGS Bony calvarium is intact. Diffuse atrophic changes and chronic white matter ischemic changes are noted. No findings to suggest acute hemorrhage, acute infarction or space-occupying mass lesion are seen. CT CERVICAL SPINE FINDINGS Seven cervical segments are well visualized. Disc space narrowing and facet hypertrophic changes are noted at multiple levels. No findings to suggest acute fracture or acute facet abnormality are seen. Surrounding soft tissues demonstrate diffuse carotid calcifications. Multiple thyroid nodules are noted. Most prominent of these lies on the right and measures approximately 2 cm in greatest dimension. Visualized lung apices demonstrate mild changes of COPD. IMPRESSION: CT of the head: Chronic atrophic and ischemic changes without acute abnormality. CT of the cervical spine: Multilevel degenerative change without acute abnormality. Right thyroid nodule measuring over 2 cm. This was present on a prior CT of the chest from 20/11 and likely benign in  etiology given the lack of change. Electronically Signed   By: Inez Catalina M.D.   On: 05/24/2016 18:43   Dg Hip Unilat With Pelvis 2-3 Views Left  05/24/2016  CLINICAL DATA:  Recent fall left hip pain, initial encounter EXAM: DG HIP (WITH OR WITHOUT PELVIS) 2-3V LEFT COMPARISON:  None. FINDINGS: There is evidence of subcapital femoral neck fracture with impaction and angulation at the fracture site. Prior right hip prosthesis is noted. The pelvic ring is intact. Lumbar degenerative changes are seen. IMPRESSION: Proximal left femoral fracture as described. Electronically Signed   By: Inez Catalina M.D.   On: 05/24/2016 18:26   ASSESSMENT AND PLAN:   1. Acute left hip fracture closed,Due to mechanical fall. Patient is status post surgery day 0. - No contraindications to surgery at this time. 2. COPD with chronic respiratory failure on chronic oxygen. Respiratory status currently stable. 3. History of congestive heart failure. Currently no signs of heart failure and her mother actually starting fluids for low blood pressure. 4. Relative hypotension secondary to pain medication. Give a small IV fluid bolus and gentle IV fluid hydration. 5. History of arrhythmia on amiodarone 6. Hypothyroidism unspecified on levothyroxine 7. Hyperlipidemia unspecified on Lipitor 8. DVT prophylaxis per orthopedic.  All the records are reviewed and case discussed with Care Management/Social Workerr. Management plans discussed with the patient, family and they are in agreement.  CODE STATUS: full  TOTAL TIME TAKING CARE OF THIS PATIENT: 30 minutes.   POSSIBLE D/C IN 2-3 DAYS, DEPENDING ON CLINICAL CONDITION.   Arlanda Shiplett M.D on 05/25/2016 at 3:12 PM  Between 7am to 6pm - Pager - [phone]  After 6pm go to [email or web address] - password [email or web address] [facility]  Office  [phone]  CC: Primary care physician; Pcp Not In System

## 2016-05-26 LAB — CBC
HCT: 27 % — ABNORMAL LOW (ref 35.0–47.0)
HEMOGLOBIN: 8.8 g/dL — AB (ref 12.0–16.0)
MCH: 29.3 pg (ref 26.0–34.0)
MCHC: 32.6 g/dL (ref 32.0–36.0)
MCV: 89.9 fL (ref 80.0–100.0)
PLATELETS: 216 10*3/uL (ref 150–440)
RBC: 3.01 MIL/uL — AB (ref 3.80–5.20)
RDW: 14.8 % — ABNORMAL HIGH (ref 11.5–14.5)
WBC: 13.4 10*3/uL — ABNORMAL HIGH (ref 3.6–11.0)

## 2016-05-26 LAB — BASIC METABOLIC PANEL
Anion gap: 7 (ref 5–15)
BUN: 19 mg/dL (ref 6–20)
CHLORIDE: 109 mmol/L (ref 101–111)
CO2: 25 mmol/L (ref 22–32)
CREATININE: 1.15 mg/dL — AB (ref 0.44–1.00)
Calcium: 8.1 mg/dL — ABNORMAL LOW (ref 8.9–10.3)
GFR calc Af Amer: 44 mL/min — ABNORMAL LOW (ref >60)
GFR calc non Af Amer: 38 mL/min — ABNORMAL LOW (ref >60)
GLUCOSE: 180 mg/dL — AB (ref 65–99)
POTASSIUM: 4.2 mmol/L (ref 3.5–5.1)
Sodium: 141 mmol/L (ref 135–145)

## 2016-05-26 NOTE — Evaluation (Signed)
Occupational Therapy Evaluation Patient Details Name: Kristen Mcdonald MRN: [id number] DOB: 03/30/1913 Today's Date: 05/26/2016    History of Present Illness Kristen Mcdonald is a 82 y.o. female. She presented to the hospital when she went outside to take out the trash and had a fall. She flagged down some people on the street and they carried her into her home. She was having severe pain in her left hip area. In the ER she was found to have a left fracture. No loss of consciousness with her fall. She lives independently at her home by herself and gets around pretty well. No complaints of chest pain. She does have some shortness of breath and she wears oxygen 24/ 7. Pt underwent L hemiarthroplasty posterior approach. Pt has fallen multiple times over the last 12 months. Pt is very hard of hearing. Grandson reports that pt will trip on O2 cannula. Niece present this date to provide some history and she reports grandson helps the patient often.    Clinical Impression   Patient seen for OT evaluation this date, she presents with muscle weakness both UE and LE, decreased ability to perform transfers, functional mobility, decreased ADL tasks , IADL tasks and has a history of falls.  She lives alone and was independent with basic self care tasks and light housekeeping prior to admission and would benefit from skilled OT to maximize her safety and independence in daily tasks. She would benefit from STR upon discharge.    Follow Up Recommendations  SNF    Equipment Recommendations       Recommendations for Other Services       Precautions / Restrictions Precautions Precautions: Posterior Hip;Fall Precaution Booklet Issued: Yes (comment) Restrictions Weight Bearing Restrictions: Yes LLE Weight Bearing: Partial weight bearing LLE Partial Weight Bearing Percentage or Pounds: 25-50%      Mobility Bed Mobility Overal bed mobility: Needs Assistance Bed Mobility: Supine to Sit;Sit to Supine      Supine to sit: +2 for physical assistance;Max assist Sit to supine: Max assist;+2 for physical assistance   General bed mobility comments: Pt is very painful with bed mobility. Yells and groans during both phases of transfer. Once upright able to remain sitting without assistance. MaxA+2 to scoot toward EOB  Transfers                 General transfer comment: Unable to attempt transfers at this timen secondary to pain and weakness.  Patient states, "I just had surgery and everyone wants to get me up!"    Balance Overall balance assessment: Needs assistance Sitting-balance support: No upper extremity supported Sitting balance-Leahy Scale: Fair                                      ADL Overall ADL's : Needs assistance/impaired Eating/Feeding: Set up;Bed level   Grooming: Set up;Bed level   Upper Body Bathing: Minimal assitance   Lower Body Bathing: +2 for physical assistance   Upper Body Dressing : Maximal assistance   Lower Body Dressing: +2 for physical assistance   Toilet Transfer: +2 for physical assistance Toilet Transfer Details (indicate cue type and reason): per clinical judgment           General ADL Comments: Patient with increased pain with movement and requires 2 people for transfers.     Vision     Perception     Praxis  Pertinent Vitals/Pain Pain Assessment: 0-10 Pain Score: 7  Pain Location: pain with movement, Left hip, no pain at rest.  Pain Descriptors / Indicators: Operative site guarding Pain Intervention(s): Limited activity within patient's tolerance;Monitored during session     Hand Dominance Right   Extremity/Trunk Assessment Upper Extremity Assessment Upper Extremity Assessment: Generalized weakness   Lower Extremity Assessment Lower Extremity Assessment: LLE deficits/detail;Defer to PT evaluation LLE Deficits / Details: see PT note LLE: Unable to fully assess due to pain       Communication  Communication Communication: HOH   Cognition Arousal/Alertness: Lethargic;Suspect due to medications Behavior During Therapy: Restless Overall Cognitive Status: Within Functional Limits for tasks assessed Area of Impairment: Orientation Orientation Level: Disoriented to;Time (Able to provide month but not year)             General Comments: Pt is very lethargic during PT session. Received morphine 15 minutes prior to evaluation   General Comments       Exercises Exercises: Total Joint     Shoulder Instructions      Home Living Family/patient expects to be discharged to:: Skilled nursing facility Living Arrangements: Alone Available Help at Discharge: Family Type of Home: House Home Access: Stairs to enter [employer] of Steps: 3 Entrance Stairs-Rails: Left Home Layout: One level     Bathroom Shower/Tub: Teacher, early years/pre: Handicapped height     Home Equipment: Toilet riser;Bedside commode;Walker - 4 wheels   Additional Comments: Pt has life alert system. Transportation provided by family.  She gets meals on wheels.      Prior Functioning/Environment Level of Independence: Needs assistance  Gait / Transfers Assistance Needed: ambulates with single point cane for limited community mobility. Rollator around the house ADL's / Homemaking Assistance Needed: Independent with bathing/dressing. Assist for IADLs   Comments: Pt on 2 L O2 at home    OT Diagnosis: Generalized weakness;Acute pain (decreased self care tasks. )   OT Problem List: Decreased strength;Impaired balance (sitting and/or standing);Decreased cognition;Decreased knowledge of precautions;Pain;Decreased range of motion;Decreased safety awareness;Decreased activity tolerance;Decreased knowledge of use of DME or AE   OT Treatment/Interventions: Self-care/ADL training;Therapeutic exercise;Patient/family education;Balance training;Therapeutic activities;DME and/or AE  instruction;Cognitive remediation/compensation    OT Goals(Current goals can be found in the care plan section) Acute Rehab OT Goals Patient Stated Goal: To go home but is agreeable to SNF to get stronger, patient wants to be able to live alone again. OT Goal Formulation: With patient Time For Goal Achievement: 06/09/16 Potential to Achieve Goals: Good  OT Frequency: Min 1X/week   Barriers to D/C:            Co-evaluation              End of Session    Activity Tolerance: Patient limited by lethargy;Patient limited by pain Patient left: in bed;with call bell/phone within reach;with bed alarm set;with family/visitor present   Time: [id number] OT Time Calculation (min): 17 min Charges:  OT General Charges $OT Visit: 1 Procedure OT Evaluation $OT Eval Low Complexity: 1 Procedure G-Codes:    Lovett,Amy  Amy T Lovett, OTR/L, CLT  05/26/2016, 12:07 PM

## 2016-05-26 NOTE — Evaluation (Signed)
Physical Therapy Evaluation Patient Details Name: Kristen Mcdonald MRN: [id number] DOB: 09-11-1913 Today's Date: 05/26/2016   History of Present Illness  Kristen Mcdonald is a 82 y.o. female. She presented to the hospital when she went outside to take out the trash and had a fall. She flagged down some people on the street and they carried her into her home. She was having severe pain in her left hip area. In the ER she was found to have a left fracture. No loss of consciousness with her fall. She lives independently at her home by herself and gets around pretty well. No complaints of chest pain. She does have some shortness of breath and she wears oxygen 24/ 7. After some pain medication and her blood pressure did come down a little bit. Pt underwent L hemiarthroplasty posterior approach. Pt has fallen multiple times over the last 12 months. Grandson provides history. Pt is very hard of hearing. Grandson reports that pt will trip on O2 cannula.   Clinical Impression  Pt is very painful with all mobility which limits PT evaluation. She also is somewhat confused currently and very lethargic making attempting transfers and ambulation unsafe currently. Pt is able to complete all bed exercises as instructed and demonstrates considerable LLE limitations secondary to pain. She requires maxA+2 for all bed mobility and is only able to remain sitting at EOB for short bout secondary to pain. Pt will benefit from skilled PT services to address deficits in strength, balance, and mobility in order to return to full function at home.     Follow Up Recommendations SNF    Equipment Recommendations  Other (comment) (TBD at facility)    Recommendations for Other Services       Precautions / Restrictions Precautions Precautions: Posterior Hip Precaution Booklet Issued: Yes (comment) Restrictions Weight Bearing Restrictions: Yes LLE Weight Bearing: Partial weight bearing LLE Partial Weight Bearing Percentage or  Pounds: 25-50%      Mobility  Bed Mobility Overal bed mobility: Needs Assistance Bed Mobility: Supine to Sit;Sit to Supine     Supine to sit: +2 for physical assistance;Max assist Sit to supine: Max assist;+2 for physical assistance   General bed mobility comments: Pt is very painful with bed mobility. Yells and groans during both phases of transfer. Once upright able to remain sitting without assistance. MaxA+2 to scoot toward EOB  Transfers                 General transfer comment: Unable to attempt transfers at this timen secondary to pain and weakness  Ambulation/Gait             General Gait Details: Unable to attempt ambulation at this time secondary to pain and weakness  Stairs            Wheelchair Mobility    Modified Rankin (Stroke Patients Only)       Balance Overall balance assessment: Needs assistance Sitting-balance support: No upper extremity supported Sitting balance-Leahy Scale: Fair                                       Pertinent Vitals/Pain Pain Assessment: 0-10 Pain Score: 0-No pain Pain Location: Denies L hip pain at rest    [place] expects to be discharged to:: Skilled nursing facility Living Arrangements: Alone Available Help at Discharge: Family Type of Home: House Home Access: Stairs to enter Entrance Stairs-Rails:  Left Entrance Stairs-Number of Steps: 3 Home Layout: One level Home Equipment: Toilet riser;Bedside commode;Walker - 4 wheels (may have rolling walker) Additional Comments: Pt has life alert system. Transportation provided by family.    Prior Function Level of Independence: Needs assistance   Gait / Transfers Assistance Needed: ambulates with single point cane for limited community mobility. Rollator around the house  ADL's / Homemaking Assistance Needed: Independent with bathing/dressing. Assist for IADLs  Comments: Pt on 2 L O2 at home     Hand Dominance    Dominant Hand: Right    Extremity/Trunk Assessment   Upper Extremity Assessment: Generalized weakness           Lower Extremity Assessment: LLE deficits/detail   LLE Deficits / Details: Denies numbness/tingling in LLE. Requires assist for SAQ and SLR. Able to perform L ankle DF/PF without assistance     Communication   Communication: HOH  Cognition Arousal/Alertness: Lethargic;Suspect due to medications Behavior During Therapy: Restless Overall Cognitive Status: Impaired/Different from baseline Area of Impairment: Orientation Orientation Level: Disoriented to;Time (Able to provide month but not year)             General Comments: Pt is very lethargic during PT session. Received morphine 15 minutes prior to evaluation    General Comments      Exercises Total Joint Exercises Ankle Circles/Pumps: Strengthening;Both;10 reps;Supine Quad Sets: Strengthening;Both;10 reps;Supine Towel Squeeze: Strengthening;Both;10 reps;Supine Short Arc Quad: Strengthening;Left;10 reps;Supine Heel Slides: Strengthening;Left;10 reps;Supine Hip ABduction/ADduction: Strengthening;Left;10 reps;Supine Straight Leg Raises: Strengthening;Left;10 reps;Supine      Assessment/Plan    PT Assessment Patient needs continued PT services  PT Diagnosis Difficulty walking;Generalized weakness;Acute pain   PT Problem List Decreased strength;Decreased range of motion;Decreased activity tolerance;Decreased balance;Decreased mobility;Decreased cognition;Decreased knowledge of use of DME;Decreased safety awareness;Decreased knowledge of precautions;Pain  PT Treatment Interventions DME instruction;Gait training;Stair training;Functional mobility training;Therapeutic activities;Therapeutic exercise;Balance training;Neuromuscular re-education;Cognitive remediation;Patient/family education;Manual techniques   PT Goals (Current goals can be found in the Care Plan section) Acute Rehab PT Goals Patient Stated Goal:  To go home PT Goal Formulation: With patient Time For Goal Achievement: 06/09/16 Potential to Achieve Goals: Fair    Frequency BID   Barriers to discharge Decreased caregiver support Lives alone however family very supportive    Co-evaluation               End of Session Equipment Utilized During Treatment: Gait belt;Oxygen Activity Tolerance: Patient limited by pain Patient left: in bed;with call bell/phone within reach;with bed alarm set;with family/visitor present Nurse Communication: Mobility status;Other (comment) (posterior hip precautions)         Time: [id number] PT Time Calculation (min) (ACUTE ONLY): 35 min   Charges:   PT Evaluation $PT Eval Moderate Complexity: 1 Procedure PT Treatments $Therapeutic Exercise: 8-22 mins   PT G Codes:       Lyndel Safe Niyati Heinke PT, DPT   Chiann Goffredo 05/26/2016, 10:38 AM

## 2016-05-26 NOTE — Progress Notes (Signed)
Pt in bed awake HOH no distress noted. Family member at bedside

## 2016-05-26 NOTE — Progress Notes (Signed)
PT is recommending SNF. Clinical Social Worker (CSW) contacted patient's HPOA/niece Pamala Hurry and presented bed offers. [facility] chose [facility] liaison is aware of accepted bed offer. CSW will continue to follow and assist as needed.   Blima Rich, LCSW [phone]

## 2016-05-26 NOTE — Progress Notes (Signed)
Physical Therapy Treatment Patient Details Name: Kristen Mcdonald MRN: [id number] DOB: 08-10-1913 Today's Date: 05/26/2016    History of Present Illness Kristen Mcdonald is a 82 y.o. female. She presented to the hospital when she went outside to take out the trash and had a fall. She flagged down some people on the street and they carried her into her home. She was having severe pain in her left hip area. In the ER she was found to have a left fracture. No loss of consciousness with her fall. She lives independently at her home by herself and gets around pretty well. No complaints of chest pain. She does have some shortness of breath and she wears oxygen 24/ 7. Pt underwent L hemiarthroplasty posterior approach. Pt has fallen multiple times over the last 12 months. Pt is very hard of hearing. Grandson reports that pt will trip on O2 cannula. Niece present this date to provide some history and she reports grandson helps the patient often.     PT Comments    Pt demonstrates increased lethargy this afternoon. She keeps her eyes closed for most of afternoon treatment session. She requires continual cues and redirection to complete exercises. Pt performs exercises very slow requiring increased time due to lethargy and pain. Bed mobility and transfers deferred this PM due to lethargy and pain. Will attempt transfers tomorrow if patient is able to tolerate. Pt will benefit from skilled PT services to address deficits in strength, balance, and mobility in order to return to full function at home.   Follow Up Recommendations  SNF     Equipment Recommendations  Other (comment) (TBD at facility)    Recommendations for Other Services       Precautions / Restrictions Precautions Precautions: Posterior Hip;Fall Precaution Booklet Issued: Yes (comment) Restrictions Weight Bearing Restrictions: Yes LLE Weight Bearing: Partial weight bearing LLE Partial Weight Bearing Percentage or Pounds: 25-50%     Mobility  Bed Mobility               General bed mobility comments: Bed mobility and transfers deferred this PM due to increased lethargy  Transfers                    Ambulation/Gait                 Stairs            Wheelchair Mobility    Modified Rankin (Stroke Patients Only)       Balance                                    Cognition Arousal/Alertness: Lethargic;Suspect due to medications Behavior During Therapy: Flat affect Overall Cognitive Status: Within Functional Limits for tasks assessed Area of Impairment: Orientation Orientation Level: Disoriented to;Time (Able to provide month but not year)             General Comments: Niece present uring treatment and states she is at baseline cognitive status    Exercises Total Joint Exercises Ankle Circles/Pumps: Strengthening;Both;Supine;15 reps;Other (comment) (2 sets) Quad Sets: Strengthening;Both;Supine;Other (comment);15 reps (2 sets) Towel Squeeze: Strengthening;Both;Supine;15 reps;Other (comment) (2 sets) Short Arc Quad: Strengthening;Left;Supine;15 reps Heel Slides: Strengthening;Left;Supine;15 reps Hip ABduction/ADduction: Strengthening;Left;Supine;15 reps;Other (comment) (2 sets) Straight Leg Raises: Strengthening;Left;Supine;15 reps    General Comments        Pertinent Vitals/Pain Pain Assessment: Faces Faces Pain Scale: Hurts even more Pain Location:  L hip pain with movement. Reports "a little pain" at rest Pain Intervention(s): Monitored during session;Limited activity within patient's tolerance    Home Living                      Prior Function            PT Goals (current goals can now be found in the care plan section) Acute Rehab PT Goals Patient Stated Goal: To go home but is agreeable to SNF to get stronger, patient wants to be able to live alone again. PT Goal Formulation: With patient Time For Goal Achievement:  06/09/16 Potential to Achieve Goals: Fair Progress towards PT goals: Progressing toward goals    Frequency  BID    PT Plan Current plan remains appropriate    Co-evaluation             End of Session Equipment Utilized During Treatment: Oxygen Activity Tolerance: Patient limited by pain;Patient limited by lethargy Patient left: in bed;with call bell/phone within reach;with bed alarm set;with family/visitor present;with SCD's reapplied;Other (comment) (abduction pillow between knees)     Time: [id number] PT Time Calculation (min) (ACUTE ONLY): 28 min  Charges:  $Therapeutic Exercise: 23-37 mins                    G Codes:      Lyndel Safe Rockelle Heuerman PT, DPT   Jeannifer Drakeford 05/26/2016, 5:29 PM

## 2016-05-26 NOTE — Plan of Care (Signed)
Problem: Pain Managment: Goal: General experience of comfort will improve Outcome: Not Progressing Patient continues to complain of pain with movement

## 2016-05-26 NOTE — Progress Notes (Signed)
Patient ID: Kristen Mcdonald, female   DOB: [birth year], 82 y.o.   MRN: [id number] [facility] NAME: Kristen Mcdonald    MR#:  [id number]  DATE OF BIRTH:  [birth year]  SUBJECTIVE:  POD #1 hip fracture.  Ate well Low grade fever  REVIEW OF SYSTEMS:   Review of Systems  Constitutional: Positive for fever. Negative for chills and weight loss.  HENT: Negative for ear discharge, ear pain and nosebleeds.   Eyes: Negative for blurred vision, pain and discharge.  Respiratory: Negative for sputum production, shortness of breath, wheezing and stridor.   Cardiovascular: Negative for chest pain, palpitations, orthopnea and PND.  Gastrointestinal: Negative for nausea, vomiting, abdominal pain and diarrhea.  Genitourinary: Negative for urgency and frequency.  Musculoskeletal: Positive for joint pain. Negative for back pain.  Neurological: Negative for sensory change, speech change, focal weakness and weakness.  Psychiatric/Behavioral: Negative for depression and hallucinations. The patient is not nervous/anxious.   All other systems reviewed and are negative.   DRUG ALLERGIES:  No Known Allergies  VITALS:  Blood pressure 89/51, pulse 70, temperature 100.6 F (38.1 C), temperature source Oral, resp. rate 16, weight 63.912 kg (140 lb 14.4 oz), SpO2 98 %.  PHYSICAL EXAMINATION:  GENERAL:  82 y.o.-year-old patient lying in the bed with no acute distress.  EYES: Pupils equal, round, reactive to light and accommodation. No scleral icterus. Extraocular muscles intact.  HEENT: Head atraumatic, normocephalic. Oropharynx and nasopharynx clear.  NECK:  Supple, no jugular venous distention. No thyroid enlargement, no tenderness.  LUNGS: Normal breath sounds bilaterally, no wheezing, rales,rhonchi or crepitation. No use of accessory muscles of respiration.  CARDIOVASCULAR: S1, S2 normal. No murmurs, rubs, or gallops.  ABDOMEN: Soft, nontender,  nondistended. Bowel sounds present. No organomegaly or mass.  EXTREMITIES: No pedal edema, cyanosis, or clubbing.  NEUROLOGIC: Cranial nerves II through XII are intact. Muscle strength 5/5 in all extremities. Sensation intact. Gait not checked.  PSYCHIATRIC: The patient is alert and oriented x 3.  SKIN: No obvious rash, lesion, or ulcer.    LABORATORY PANEL:   CBC  Recent Labs Lab 05/25/16 0345 05/26/16 0400  WBC 14.7* 13.4*  HGB 10.7* 8.8*  HCT 32.3* 27.0*  PLT 254 216   ------------------------------------------------------------------------------------------------------------------  Chemistries   Recent Labs Lab 05/25/16 0345 05/26/16 0400  NA 139 141  K 3.9 4.2  CL 107 109  CO2 27 25  GLUCOSE 140* 180*  BUN 19 19  CREATININE 1.22* 1.15*  CALCIUM 8.3* 8.1*   ------------------------------------------------------------------------------------------------------------------  Cardiac Enzymes No results for input(s): TROPONINI in the last 168 hours. ------------------------------------------------------------------------------------------------------------------  RADIOLOGY:  Dg Chest 1 View  05/24/2016  CLINICAL DATA:  Recent fall with left hip fracture, initial encounter EXAM: CHEST 1 VIEW COMPARISON:  02/07/2016 FINDINGS: Cardiac shadow is enlarged but stable. Diffuse aortic calcifications are again seen. Scattered interstitial changes are noted bilaterally without focal infiltrate or sizable effusion. No acute bony abnormality is noted. IMPRESSION: Chronic changes without acute abnormality. Aortic atherosclerotic disease. Electronically Signed   By: Inez Catalina M.D.   On: 05/24/2016 18:26   Ct Head Wo Contrast  05/24/2016  CLINICAL DATA:  Recent fall with known left hip fracture and closed head injury, initial encounter EXAM: CT HEAD WITHOUT CONTRAST CT CERVICAL SPINE WITHOUT CONTRAST TECHNIQUE: Multidetector CT imaging of the head and cervical spine was performed  following the standard protocol without intravenous contrast. Multiplanar CT image reconstructions of the cervical spine were also  generated. COMPARISON:  01/03/2007 FINDINGS: CT HEAD FINDINGS Bony calvarium is intact. Diffuse atrophic changes and chronic white matter ischemic changes are noted. No findings to suggest acute hemorrhage, acute infarction or space-occupying mass lesion are seen. CT CERVICAL SPINE FINDINGS Seven cervical segments are well visualized. Disc space narrowing and facet hypertrophic changes are noted at multiple levels. No findings to suggest acute fracture or acute facet abnormality are seen. Surrounding soft tissues demonstrate diffuse carotid calcifications. Multiple thyroid nodules are noted. Most prominent of these lies on the right and measures approximately 2 cm in greatest dimension. Visualized lung apices demonstrate mild changes of COPD. IMPRESSION: CT of the head: Chronic atrophic and ischemic changes without acute abnormality. CT of the cervical spine: Multilevel degenerative change without acute abnormality. Right thyroid nodule measuring over 2 cm. This was present on a prior CT of the chest from 20/11 and likely benign in etiology given the lack of change. Electronically Signed   By: Inez Catalina M.D.   On: 05/24/2016 18:43   Ct Cervical Spine Wo Contrast  05/24/2016  CLINICAL DATA:  Recent fall with known left hip fracture and closed head injury, initial encounter EXAM: CT HEAD WITHOUT CONTRAST CT CERVICAL SPINE WITHOUT CONTRAST TECHNIQUE: Multidetector CT imaging of the head and cervical spine was performed following the standard protocol without intravenous contrast. Multiplanar CT image reconstructions of the cervical spine were also generated. COMPARISON:  01/03/2007 FINDINGS: CT HEAD FINDINGS Bony calvarium is intact. Diffuse atrophic changes and chronic white matter ischemic changes are noted. No findings to suggest acute hemorrhage, acute infarction or  space-occupying mass lesion are seen. CT CERVICAL SPINE FINDINGS Seven cervical segments are well visualized. Disc space narrowing and facet hypertrophic changes are noted at multiple levels. No findings to suggest acute fracture or acute facet abnormality are seen. Surrounding soft tissues demonstrate diffuse carotid calcifications. Multiple thyroid nodules are noted. Most prominent of these lies on the right and measures approximately 2 cm in greatest dimension. Visualized lung apices demonstrate mild changes of COPD. IMPRESSION: CT of the head: Chronic atrophic and ischemic changes without acute abnormality. CT of the cervical spine: Multilevel degenerative change without acute abnormality. Right thyroid nodule measuring over 2 cm. This was present on a prior CT of the chest from 20/11 and likely benign in etiology given the lack of change. Electronically Signed   By: Inez Catalina M.D.   On: 05/24/2016 18:43   Dg Hip Unilat With Pelvis 1v Left  05/25/2016  CLINICAL DATA:  Status post left hip replacement. EXAM: DG HIP (WITH OR WITHOUT PELVIS) 1V*L* COMPARISON:  Radiograph of May 24, 2016. FINDINGS: Right hip arthroplasty is unchanged. Vascular calcifications are noted. Interval placement of left hip hemiarthroplasty. Prosthesis appears to be well positioned. Surgical drains and other expected postoperative changes are seen in the surrounding soft tissues. No acute fracture or dislocation is noted. IMPRESSION: Status post left hip hemiarthroplasty. Electronically Signed   By: Marijo Conception, M.D.   On: 05/25/2016 15:24   Dg Hip Unilat With Pelvis 2-3 Views Left  05/24/2016  CLINICAL DATA:  Recent fall left hip pain, initial encounter EXAM: DG HIP (WITH OR WITHOUT PELVIS) 2-3V LEFT COMPARISON:  None. FINDINGS: There is evidence of subcapital femoral neck fracture with impaction and angulation at the fracture site. Prior right hip prosthesis is noted. The pelvic ring is intact. Lumbar degenerative changes  are seen. IMPRESSION: Proximal left femoral fracture as described. Electronically Signed   By: Linus Mako.D.  On: 05/24/2016 18:26   ASSESSMENT AND PLAN:   1. Acute left hip fracture closed,Due to mechanical fall. Patient is status post surgery day 1. - No contraindications to surgery at this time. 2. COPD with chronic respiratory failure on chronic oxygen. Respiratory status currently stable. 3. History of congestive heart failure. Currently no signs of heart failure and her mother actually starting fluids for low blood pressure. 4. Relative hypotension secondary to pain medication. give IV bolus if needed. Pt asymptomatic 5. History of arrhythmia on amiodarone 6. Hypothyroidism unspecified on levothyroxine 7. Hyperlipidemia unspecified on Lipitor 8. DVT prophylaxis per orthopedic.  All the records are reviewed and case discussed with Care Management/Social Workerr. Management plans discussed with the patient, family and they are in agreement.  CODE STATUS: full  TOTAL TIME TAKING CARE OF THIS PATIENT: 30 minutes.   POSSIBLE D/C IN 2-3 DAYS, DEPENDING ON CLINICAL CONDITION.   Tyleigh Mahn M.D on 05/26/2016 at 2:08 PM  Between 7am to 6pm - Pager - [phone]  After 6pm go to [email or web address] - password [email or web address] [facility]  Office  [phone]  CC: Primary care physician; Pcp Not In System

## 2016-05-26 NOTE — Progress Notes (Addendum)
Subjective:  POD #1 s/p left hip hemiarthroplasty.  Patient reports pain as minimal at rest but moderate with left hip movement.  No other complaints.  Objective:   VITALS:   Filed Vitals:   05/25/16 1755 05/25/16 2030 05/26/16 0500 05/26/16 0854  BP: 120/60 119/58 130/90 89/51  Pulse: 58 65 71 70  Temp: 98.8 F (37.1 C) 98.2 F (36.8 C) 99.4 F (37.4 C) 100.6 F (38.1 C)  TempSrc: Oral Oral Oral Oral  Resp: 18 18 24 16   Weight:      SpO2: 99% 97% 97% 98%    PHYSICAL EXAM:  Left hip:  Dressing with mild sanguinous drainage. No evidence of active bleeding. Left thigh with mild swelling. Compartments are soft and compressible. Distally patient has intact sensation to light touch, palpable pedal pulses and intact motor function. Patient can dorsiflex and plantarflex her ankle and flex and extend her toes.  I personally removed the Hemovac drain from the left hip today. I have examined the ends of the drain which show no evidence of breakage during removal.  LABS  Results for orders placed or performed during the hospital encounter of 05/24/16 (from the past 24 hour(s))  CBC     Status: Abnormal   Collection Time: 05/26/16  4:00 AM  Result Value Ref Range   WBC 13.4 (H) 3.6 - 11.0 K/uL   RBC 3.01 (L) 3.80 - 5.20 MIL/uL   Hemoglobin 8.8 (L) 12.0 - 16.0 g/dL   HCT 27.0 (L) 35.0 - 47.0 %   MCV 89.9 80.0 - 100.0 fL   MCH 29.3 26.0 - 34.0 pg   MCHC 32.6 32.0 - 36.0 g/dL   RDW 14.8 (H) 11.5 - 14.5 %   Platelets 216 150 - 440 K/uL  Basic metabolic panel     Status: Abnormal   Collection Time: 05/26/16  4:00 AM  Result Value Ref Range   Sodium 141 135 - 145 mmol/L   Potassium 4.2 3.5 - 5.1 mmol/L   Chloride 109 101 - 111 mmol/L   CO2 25 22 - 32 mmol/L   Glucose, Bld 180 (H) 65 - 99 mg/dL   BUN 19 6 - 20 mg/dL   Creatinine, Ser 1.15 (H) 0.44 - 1.00 mg/dL   Calcium 8.1 (L) 8.9 - 10.3 mg/dL   GFR calc non Af Amer 38 (L) >60 mL/min   GFR calc Af Amer 44 (L) >60 mL/min   Anion  gap 7 5 - 15    Dg Chest 1 View  05/24/2016  CLINICAL DATA:  Recent fall with left hip fracture, initial encounter EXAM: CHEST 1 VIEW COMPARISON:  02/07/2016 FINDINGS: Cardiac shadow is enlarged but stable. Diffuse aortic calcifications are again seen. Scattered interstitial changes are noted bilaterally without focal infiltrate or sizable effusion. No acute bony abnormality is noted. IMPRESSION: Chronic changes without acute abnormality. Aortic atherosclerotic disease. Electronically Signed   By: Inez Catalina M.D.   On: 05/24/2016 18:26   Ct Head Wo Contrast  05/24/2016  CLINICAL DATA:  Recent fall with known left hip fracture and closed head injury, initial encounter EXAM: CT HEAD WITHOUT CONTRAST CT CERVICAL SPINE WITHOUT CONTRAST TECHNIQUE: Multidetector CT imaging of the head and cervical spine was performed following the standard protocol without intravenous contrast. Multiplanar CT image reconstructions of the cervical spine were also generated. COMPARISON:  01/03/2007 FINDINGS: CT HEAD FINDINGS Bony calvarium is intact. Diffuse atrophic changes and chronic white matter ischemic changes are noted. No findings to suggest acute hemorrhage, acute  infarction or space-occupying mass lesion are seen. CT CERVICAL SPINE FINDINGS Seven cervical segments are well visualized. Disc space narrowing and facet hypertrophic changes are noted at multiple levels. No findings to suggest acute fracture or acute facet abnormality are seen. Surrounding soft tissues demonstrate diffuse carotid calcifications. Multiple thyroid nodules are noted. Most prominent of these lies on the right and measures approximately 2 cm in greatest dimension. Visualized lung apices demonstrate mild changes of COPD. IMPRESSION: CT of the head: Chronic atrophic and ischemic changes without acute abnormality. CT of the cervical spine: Multilevel degenerative change without acute abnormality. Right thyroid nodule measuring over 2 cm. This was  present on a prior CT of the chest from 20/11 and likely benign in etiology given the lack of change. Electronically Signed   By: Inez Catalina M.D.   On: 05/24/2016 18:43   Ct Cervical Spine Wo Contrast  05/24/2016  CLINICAL DATA:  Recent fall with known left hip fracture and closed head injury, initial encounter EXAM: CT HEAD WITHOUT CONTRAST CT CERVICAL SPINE WITHOUT CONTRAST TECHNIQUE: Multidetector CT imaging of the head and cervical spine was performed following the standard protocol without intravenous contrast. Multiplanar CT image reconstructions of the cervical spine were also generated. COMPARISON:  01/03/2007 FINDINGS: CT HEAD FINDINGS Bony calvarium is intact. Diffuse atrophic changes and chronic white matter ischemic changes are noted. No findings to suggest acute hemorrhage, acute infarction or space-occupying mass lesion are seen. CT CERVICAL SPINE FINDINGS Seven cervical segments are well visualized. Disc space narrowing and facet hypertrophic changes are noted at multiple levels. No findings to suggest acute fracture or acute facet abnormality are seen. Surrounding soft tissues demonstrate diffuse carotid calcifications. Multiple thyroid nodules are noted. Most prominent of these lies on the right and measures approximately 2 cm in greatest dimension. Visualized lung apices demonstrate mild changes of COPD. IMPRESSION: CT of the head: Chronic atrophic and ischemic changes without acute abnormality. CT of the cervical spine: Multilevel degenerative change without acute abnormality. Right thyroid nodule measuring over 2 cm. This was present on a prior CT of the chest from 20/11 and likely benign in etiology given the lack of change. Electronically Signed   By: Inez Catalina M.D.   On: 05/24/2016 18:43   Dg Hip Unilat With Pelvis 1v Left  05/25/2016  CLINICAL DATA:  Status post left hip replacement. EXAM: DG HIP (WITH OR WITHOUT PELVIS) 1V*L* COMPARISON:  Radiograph of May 24, 2016. FINDINGS:  Right hip arthroplasty is unchanged. Vascular calcifications are noted. Interval placement of left hip hemiarthroplasty. Prosthesis appears to be well positioned. Surgical drains and other expected postoperative changes are seen in the surrounding soft tissues. No acute fracture or dislocation is noted. IMPRESSION: Status post left hip hemiarthroplasty. Electronically Signed   By: Marijo Conception, M.D.   On: 05/25/2016 15:24   Dg Hip Unilat With Pelvis 2-3 Views Left  05/24/2016  CLINICAL DATA:  Recent fall left hip pain, initial encounter EXAM: DG HIP (WITH OR WITHOUT PELVIS) 2-3V LEFT COMPARISON:  None. FINDINGS: There is evidence of subcapital femoral neck fracture with impaction and angulation at the fracture site. Prior right hip prosthesis is noted. The pelvic ring is intact. Lumbar degenerative changes are seen. IMPRESSION: Proximal left femoral fracture as described. Electronically Signed   By: Inez Catalina M.D.   On: 05/24/2016 18:26    Assessment/Plan: 1 Day Post-Op   Active Problems:   Hip fracture ([place])  Patient is stable postop. Hemoglobin is 8.8. Last blood pressure  was 89/51 with a temperature of 100. Continue to monitor vital signs. Continue physical therapy. Patient is complete 24 hours of postop antibiotics. Foley is removed today. Patient will be partial weight bearing on the left lower extremity with posterior hip precautions until follow-up in the office with Dr. Sabra Heck in 1-2 weeks. Recheck labs tomorrow.   Thornton Park , MD 05/26/2016, 1:36 PM

## 2016-05-27 LAB — CBC
HCT: 23.9 % — ABNORMAL LOW (ref 35.0–47.0)
Hemoglobin: 7.9 g/dL — ABNORMAL LOW (ref 12.0–16.0)
MCH: 29.3 pg (ref 26.0–34.0)
MCHC: 33 g/dL (ref 32.0–36.0)
MCV: 88.7 fL (ref 80.0–100.0)
PLATELETS: 176 10*3/uL (ref 150–440)
RBC: 2.69 MIL/uL — ABNORMAL LOW (ref 3.80–5.20)
RDW: 14.7 % — ABNORMAL HIGH (ref 11.5–14.5)
WBC: 14.1 10*3/uL — ABNORMAL HIGH (ref 3.6–11.0)

## 2016-05-27 LAB — SURGICAL PATHOLOGY

## 2016-05-27 LAB — BASIC METABOLIC PANEL
Anion gap: 4 — ABNORMAL LOW (ref 5–15)
BUN: 22 mg/dL — AB (ref 6–20)
CHLORIDE: 107 mmol/L (ref 101–111)
CO2: 26 mmol/L (ref 22–32)
CREATININE: 1.14 mg/dL — AB (ref 0.44–1.00)
Calcium: 8.2 mg/dL — ABNORMAL LOW (ref 8.9–10.3)
GFR, EST AFRICAN AMERICAN: 44 mL/min — AB (ref >60)
GFR, EST NON AFRICAN AMERICAN: 38 mL/min — AB (ref >60)
Glucose, Bld: 154 mg/dL — ABNORMAL HIGH (ref 65–99)
POTASSIUM: 4 mmol/L (ref 3.5–5.1)
SODIUM: 137 mmol/L (ref 135–145)

## 2016-05-27 LAB — ABO/RH: ABO/RH(D): A POS

## 2016-05-27 LAB — PREPARE RBC (CROSSMATCH)

## 2016-05-27 MED ORDER — DOCUSATE SODIUM 100 MG PO CAPS
100.0000 mg | ORAL_CAPSULE | Freq: Two times a day (BID) | ORAL | Status: DC
  Administered 2016-05-27 – 2016-05-28 (×3): 100 mg via ORAL
  Filled 2016-05-27 (×3): qty 1

## 2016-05-27 MED ORDER — SODIUM CHLORIDE 0.9 % IV SOLN: Freq: Once | INTRAVENOUS | Status: DC

## 2016-05-27 MED ORDER — FUROSEMIDE 20 MG PO TABS
20.0000 mg | ORAL_TABLET | Freq: Every day | ORAL | Status: DC
  Administered 2016-05-27 – 2016-05-28 (×2): 20 mg via ORAL
  Filled 2016-05-27 (×2): qty 1

## 2016-05-27 NOTE — Progress Notes (Signed)
  Subjective:  Postoperative day #2 status post left hip hemiarthroplasty.  Patient reports pain as mild to moderate.  Patient seen with a birth the bedside.  Objective:   VITALS:   Filed Vitals:   05/26/16 1700 05/26/16 2022 05/27/16 0330 05/27/16 0745  BP: 124/44 116/42 142/50 102/58  Pulse: 72 71 74 74  Temp: 100.5 F (38.1 C) 98.6 F (37 C) 99.7 F (37.6 C) 99.2 F (37.3 C)  TempSrc: Oral Oral Oral Oral  Resp: 18 19 19 18   Weight:      SpO2: 97% 99% 98% 94%    PHYSICAL EXAM:  Left lower extremity: Dressing is been reinforced by the nurses. Sanguinous drainage on the Aquasol dressing. Neurovascular intact Sensation intact distally Intact pulses distally Dorsiflexion/Plantar flexion intact No cellulitis present Compartment soft  LABS  Results for orders placed or performed during the hospital encounter of 05/24/16 (from the past 24 hour(s))  CBC     Status: Abnormal   Collection Time: 05/27/16  5:46 AM  Result Value Ref Range   WBC 14.1 (H) 3.6 - 11.0 K/uL   RBC 2.69 (L) 3.80 - 5.20 MIL/uL   Hemoglobin 7.9 (L) 12.0 - 16.0 g/dL   HCT 23.9 (L) 35.0 - 47.0 %   MCV 88.7 80.0 - 100.0 fL   MCH 29.3 26.0 - 34.0 pg   MCHC 33.0 32.0 - 36.0 g/dL   RDW 14.7 (H) 11.5 - 14.5 %   Platelets 176 150 - 440 K/uL  Basic metabolic panel     Status: Abnormal   Collection Time: 05/27/16  5:46 AM  Result Value Ref Range   Sodium 137 135 - 145 mmol/L   Potassium 4.0 3.5 - 5.1 mmol/L   Chloride 107 101 - 111 mmol/L   CO2 26 22 - 32 mmol/L   Glucose, Bld 154 (H) 65 - 99 mg/dL   BUN 22 (H) 6 - 20 mg/dL   Creatinine, Ser 1.14 (H) 0.44 - 1.00 mg/dL   Calcium 8.2 (L) 8.9 - 10.3 mg/dL   GFR calc non Af Amer 38 (L) >60 mL/min   GFR calc Af Amer 44 (L) >60 mL/min   Anion gap 4 (L) 5 - 15  Prepare RBC     Status: None   Collection Time: 05/27/16 12:24 PM  Result Value Ref Range   Order Confirmation ORDER PROCESSED BY BLOOD BANK     Dg Hip Unilat With Pelvis 1v Left  05/25/2016   CLINICAL DATA:  Status post left hip replacement. EXAM: DG HIP (WITH OR WITHOUT PELVIS) 1V*L* COMPARISON:  Radiograph of May 24, 2016. FINDINGS: Right hip arthroplasty is unchanged. Vascular calcifications are noted. Interval placement of left hip hemiarthroplasty. Prosthesis appears to be well positioned. Surgical drains and other expected postoperative changes are seen in the surrounding soft tissues. No acute fracture or dislocation is noted. IMPRESSION: Status post left hip hemiarthroplasty. Electronically Signed   By: Marijo Conception, M.D.   On: 05/25/2016 15:24    Assessment/Plan: 2 Days Post-Op   Active Problems:   Hip fracture ([facility])   Patient's hemoglobin is 7.9 today. I discussed this case with Dr. Posey Pronto. We agreed the patient would benefit from a unit of packed red blood cells. Patient will continue with physical therapy observing posterior hip precautions on the left side. Plan for discharge tomorrow to skilled nursing facility if she is medically stable.   Thornton Park , MD 05/27/2016, 1:39 PM

## 2016-05-27 NOTE — Progress Notes (Signed)
Occupational Therapy Treatment Patient Details Name: Kristen Mcdonald MRN: [id number] DOB: 16-Nov-1913 Today's Date: 05/27/2016    History of present illness Kristen Mcdonald is a 82 y.o. female. She presented to the hospital when she went outside to take out the trash and had a fall. She flagged down some people on the street and they carried her into her home. She was having severe pain in her left hip area. In the ER she was found to have a left fracture. No loss of consciousness with her fall. She lives independently at her home by herself and gets around pretty well. No complaints of chest pain. She does have some shortness of breath and she wears oxygen 24/ 7. Pt underwent L hemiarthroplasty posterior approach. Pt has fallen multiple times over the last 12 months. Pt is very hard of hearing. Grandson reports that pt will trip on O2 cannula. Niece present this date to provide some history and she reports grandson helps the patient often.    OT comments  Patient initially sleeping and lethargic this date and therapist attempted to work towards self care tasks with use of adaptive equipment, during session patient became agitated and refused to participate further and states, "Don't mess with me!  I just got settled again" Pt reluctant to move and will not listen to therapist regarding benefits of participation and moving after surgery. Will continue attempts towards purposeful participation in basic self care tasks.   Follow Up Recommendations  SNF    Equipment Recommendations       Recommendations for Other Services      Precautions / Restrictions Precautions Precautions: Posterior Hip;Fall Restrictions Weight Bearing Restrictions: Yes LLE Weight Bearing: Partial weight bearing LLE Partial Weight Bearing Percentage or Pounds: 25-50%       Mobility Bed Mobility Overal bed mobility: Needs Assistance             General bed mobility comments: refused with attempts this  date  Transfers                 General transfer comment: refused and states firmly "Dont mess with me!"    Balance                                   ADL Overall ADL's : Needs assistance/impaired     Grooming: Set up;Bed level               Lower Body Dressing: +2 for physical assistance Lower Body Dressing Details (indicate cue type and reason): Attempted to work on Chief Technology Officer however, she became agitated and states, "Don't mess with me, I just got settled back down again."               General ADL Comments: )Patient refused to get out of bed this date and became agitated with attempts.       Vision                     Perception     Praxis      Cognition   Behavior During Therapy: Agitated Overall Cognitive Status: Within Functional Limits for tasks assessed                       Extremity/Trunk Assessment               Exercises  Shoulder Instructions       General Comments      Pertinent Vitals/ Pain       Pain Assessment: 0-10 Pain Score: 7  (with movement) Pain Location: left hip Pain Descriptors / Indicators: Operative site guarding Pain Intervention(s): Limited activity within patient's tolerance;Repositioned;Monitored during session  Home Living Family/patient expects to be discharged to:: Skilled nursing facility Living Arrangements: Alone Available Help at Discharge: Family Type of Home: House Home Access: Stairs to enter Technical brewer of Steps: 3 Entrance Stairs-Rails: Left Home Layout: One level     Bathroom Shower/Tub: Teacher, early years/pre: Handicapped height     Home Equipment: Toilet riser;Bedside commode;Walker - 4 wheels   Additional Comments: Pt has life alert system. Transportation provided by family.  She gets meals on wheels.      Prior Functioning/Environment Level of Independence: Needs assistance  Gait /  Transfers Assistance Needed: ambulates with single point cane for limited community mobility. Rollator around the house ADL's / Homemaking Assistance Needed: Independent with bathing/dressing. Assist for IADLs   Comments: Pt on 2 L O2 at home   Frequency Min 1X/week     Progress Toward Goals  OT Goals(current goals can now be found in the care plan section)  Progress towards OT goals: Not progressing toward goals - comment (Patient resistive for any OOB activities)  Acute Rehab OT Goals Patient Stated Goal: To go home but is agreeable to SNF to get stronger, patient wants to be able to live alone again. OT Goal Formulation: With patient Time For Goal Achievement: 06/09/16 Potential to Achieve Goals: Good  Plan Discharge plan remains appropriate    Co-evaluation                 End of Session     Activity Tolerance Patient limited by lethargy;Patient limited by pain   Patient Left in bed;with call bell/phone within reach;with bed alarm set;with family/visitor present   Nurse Communication          Time: 1114-1130 OT Time Calculation (min): 16 min  Charges: OT General Charges $OT Visit: 1 Procedure OT Treatments $Self Care/Home Management : 8-22 mins  Lovett,Amy  Amy T Lovett, OTR/L, CLT  05/27/2016, 11:41 AM

## 2016-05-27 NOTE — Progress Notes (Signed)
Physical Therapy Treatment Patient Details Name: Kristen Mcdonald MRN: [id number] DOB: Feb 04, 1913 Today's Date: 05/27/2016    History of Present Illness Kristen Mcdonald is a 82 y.o. female. She presented to the hospital when she went outside to take out the trash and had a fall. She flagged down some people on the street and they carried her into her home. She was having severe pain in her left hip area. In the ER she was found to have a left fracture. No loss of consciousness with her fall. She lives independently at her home by herself and gets around pretty well. No complaints of chest pain. She does have some shortness of breath and she wears oxygen 24/ 7. Pt underwent L hemiarthroplasty posterior approach. Pt has fallen multiple times over the last 12 months. Pt is very hard of hearing. Grandson reports that pt will trip on O2 cannula. Niece present this date to provide some history and she reports grandson helps the patient often.     PT Comments    Pt asleep but easily aroused.  Pt stating "I don't want the leg to hurt again".  Tried to explain importance of activity to regain strength.  Pt attempted some therex with much encouragement however stopping with each 2/2 anxiousness of increased pain and significant guarding.  Pt then stating I'm not going to do anything that will make it hurt.  Pt refused any further interventions.   Follow Up Recommendations  SNF     Equipment Recommendations       Recommendations for Other Services       Precautions / Restrictions Precautions Precautions: Posterior Hip Restrictions Weight Bearing Restrictions: Yes LLE Weight Bearing: Partial weight bearing LLE Partial Weight Bearing Percentage or Pounds: 25-50%    Mobility  Bed Mobility Overal bed mobility: Needs Assistance             General bed mobility comments: refused with attempts this date  Transfers                 General transfer comment: refused and states firmly  "Dont mess with me!"  Ambulation/Gait                 Stairs            Wheelchair Mobility    Modified Rankin (Stroke Patients Only)       Balance                                    Cognition Arousal/Alertness: Lethargic Behavior During Therapy: Flat affect Overall Cognitive Status: Within Functional Limits for tasks assessed                      Exercises Total Joint Exercises Ankle Circles/Pumps: Strengthening;Left;10 reps Quad Sets: Strengthening;Left;5 reps Gluteal Sets: Strengthening;Both;5 reps Short Arc Quad: Strengthening;5 reps;Supine Heel Slides: Strengthening;Left;5 reps    General Comments        Pertinent Vitals/Pain Pain Assessment: Faces Pain Score: 7  Pain Location: L hip  Pain Descriptors / Indicators: Operative site guarding Pain Intervention(s): Limited activity within patient's tolerance;Monitored during session    Home Living Family/patient expects to be discharged to:: Skilled nursing facility Living Arrangements: Alone Available Help at Discharge: Family Type of Home: House Home Access: Stairs to enter Entrance Stairs-Rails: Left Home Layout: One level Home Equipment: Toilet riser;Bedside commode;Walker - 4 wheels Additional Comments: Pt has  life alert system. Transportation provided by family.  She gets meals on wheels.    Prior Function Level of Independence: Needs assistance  Gait / Transfers Assistance Needed: ambulates with single point cane for limited community mobility. Rollator around the house ADL's / Homemaking Assistance Needed: Independent with bathing/dressing. Assist for IADLs Comments: Pt on 2 L O2 at home   PT Goals (current goals can now be found in the care plan section) Acute Rehab PT Goals Patient Stated Goal: To go home but is agreeable to SNF to get stronger, patient wants to be able to live alone again.    Frequency  BID    PT Plan Current plan remains appropriate     Co-evaluation             End of Session Equipment Utilized During Treatment: Oxygen Activity Tolerance: Patient limited by pain Patient left: in bed;with bed alarm set     Time: [id number] PT Time Calculation (min) (ACUTE ONLY): 12 min  Charges:  $Gait Training: 8-22 mins                    G Codes:      Kristen Mcdonald Jun 07, 2016, 2:33 PM  Kristen Mcdonald, PTA

## 2016-05-27 NOTE — Progress Notes (Signed)
Spoke with Chara, [employer] rep at [phone], to notify of non-emergent EMS transport.  Auth notification reference given as [id number].   Service date range good from 05/27/16 - 08/25/16.   Gap exception requested to determine if services can be considered at an in-network level.

## 2016-05-27 NOTE — Progress Notes (Signed)
MD requesting that in and out cath be done every 8 hours prn if the pt is unable to void on her own

## 2016-05-27 NOTE — Progress Notes (Signed)
Patient unable to void, 650 measured with bladder scan. MD notified, order to in and out cath.

## 2016-05-27 NOTE — Progress Notes (Signed)
Patient ID: Kristen Mcdonald, female   DOB: [birth year], 82 y.o.   MRN: [id number] [facility] NAME: Camani Taubman    MR#:  [id number]  DATE OF BIRTH:  1913-04-21  SUBJECTIVE:  POD #2 hip fracture.  Ate well haivng some issues with voiding Very hard on hearing REVIEW OF SYSTEMS:   Review of Systems  Constitutional: Positive for fever. Negative for chills and weight loss.  HENT: Negative for ear discharge, ear pain and nosebleeds.   Eyes: Negative for blurred vision, pain and discharge.  Respiratory: Negative for sputum production, shortness of breath, wheezing and stridor.   Cardiovascular: Negative for chest pain, palpitations, orthopnea and PND.  Gastrointestinal: Negative for nausea, vomiting, abdominal pain and diarrhea.  Genitourinary: Negative for urgency and frequency.  Musculoskeletal: Positive for joint pain. Negative for back pain.  Neurological: Negative for sensory change, speech change, focal weakness and weakness.  Psychiatric/Behavioral: Negative for depression and hallucinations. The patient is not nervous/anxious.   All other systems reviewed and are negative.   DRUG ALLERGIES:  No Known Allergies  VITALS:  Blood pressure 102/58, pulse 74, temperature 99.2 F (37.3 C), temperature source Oral, resp. rate 18, weight 63.912 kg (140 lb 14.4 oz), SpO2 94 %.  PHYSICAL EXAMINATION:  GENERAL:  82 y.o.-year-old patient lying in the bed with no acute distress.  EYES: Pupils equal, round, reactive to light and accommodation. No scleral icterus. Extraocular muscles intact.  HEENT: Head atraumatic, normocephalic. Oropharynx and nasopharynx clear.  NECK:  Supple, no jugular venous distention. No thyroid enlargement, no tenderness.  LUNGS: Normal breath sounds bilaterally, no wheezing, rales,rhonchi or crepitation. No use of accessory muscles of respiration.  CARDIOVASCULAR: S1, S2 normal. No murmurs, rubs, or  gallops.  ABDOMEN: Soft, nontender, nondistended. Bowel sounds present. No organomegaly or mass.  EXTREMITIES: No pedal edema, cyanosis, or clubbing.  NEUROLOGIC: Cranial nerves II through XII are intact. Muscle strength 5/5 in all extremities. Sensation intact. Gait not checked.  PSYCHIATRIC: The patient is alert and oriented x 3.  SKIN: No obvious rash, lesion, or ulcer.    LABORATORY PANEL:   CBC  Recent Labs Lab 05/26/16 0400 05/27/16 0546  WBC 13.4* 14.1*  HGB 8.8* 7.9*  HCT 27.0* 23.9*  PLT 216 176   ------------------------------------------------------------------------------------------------------------------  Chemistries   Recent Labs Lab 05/26/16 0400 05/27/16 0546  NA 141 137  K 4.2 4.0  CL 109 107  CO2 25 26  GLUCOSE 180* 154*  BUN 19 22*  CREATININE 1.15* 1.14*  CALCIUM 8.1* 8.2*   ------------------------------------------------------------------------------------------------------------------  Cardiac Enzymes No results for input(s): TROPONINI in the last 168 hours. ------------------------------------------------------------------------------------------------------------------  RADIOLOGY:  Dg Hip Unilat With Pelvis 1v Left  05/25/2016  CLINICAL DATA:  Status post left hip replacement. EXAM: DG HIP (WITH OR WITHOUT PELVIS) 1V*L* COMPARISON:  Radiograph of May 24, 2016. FINDINGS: Right hip arthroplasty is unchanged. Vascular calcifications are noted. Interval placement of left hip hemiarthroplasty. Prosthesis appears to be well positioned. Surgical drains and other expected postoperative changes are seen in the surrounding soft tissues. No acute fracture or dislocation is noted. IMPRESSION: Status post left hip hemiarthroplasty. Electronically Signed   By: Marijo Conception, M.D.   On: 05/25/2016 15:24   ASSESSMENT AND PLAN:   1. Acute left hip fracture closed,Due to mechanical fall. Patient is status post surgery day 2. - No contraindications to  surgery at this time. -10.8--8.8--7.9 -spoke with Dr Raliegh Ip. Will transfuse 1 unit today  2. COPD with chronic respiratory failure on chronic oxygen. Respiratory status currently stable. 3. History of congestive heart failure. Currently no signs of heart failure and her mother actually starting fluids for low blood pressure. 4. Relative hypotension secondary to pain medication. give IV bolus if needed. Pt asymptomatic 5. History of arrhythmia on amiodarone 6. Hypothyroidism unspecified on levothyroxine 7. Hyperlipidemia unspecified on Lipitor 8. DVT prophylaxis per orthopedic.   If cont to improve e d/c to rehab on Saturday BT consent obtained from [place] son's wife Eddie Dibbles harden's wife). No other family available.  All the records are reviewed and case discussed with Care Management/Social Workerr. Management plans discussed with the patient, family and they are in agreement.  CODE STATUS: full  TOTAL TIME TAKING CARE OF THIS PATIENT: 30 minutes.   D/c in am    Singleton Hickox M.D on 05/27/2016 at 12:50 PM  Between 7am to 6pm - Pager - [phone]  After 6pm go to [email or web address] - password [email or web address] [facility]  Office  [phone]  CC: Primary care physician; Pcp Not In System

## 2016-05-27 NOTE — Progress Notes (Signed)
Patient's blood transfusion complete. Patient refuses post transfusion vital signs. Hitting, yelling and scratching.

## 2016-05-27 NOTE — Care Management Important Message (Signed)
Important Message  Patient Details  Name: Kristen Mcdonald MRN: [id number] Date of Birth: [birth year]   Medicare Important Message Given:  Yes    Alvie Heidelberg, RN 05/27/2016, 9:45 AM

## 2016-05-27 NOTE — Discharge Instructions (Addendum)
Orthopedic discharge instructions: Patient has had a left hip hemiarthroplasty. She should remain partial weightbearing on the right lower extremity 4 weeks. Patient needs to observe posterior hip precautions. Patient should continue to elevate the left lower extremity and may apply ice to the surgical site prn.  The patient should continue using incentive spirometry every hour while awake. Patient should also continue TED stockings until their orthopedic follow-up. Patient will remain on Lovenox 30 mg subcutaneous twice a day while at rehab.  Patient should continue physical and occupational therapy and the SNF to work on hip range of motion, lower extremity strengthening and gait training. She should use a walker for assistance with ambulation at all times.  Patient will follow up with Dr. Sabra Heck at [facility], [place] office in 7-10 days for wound check, staple removal and x-ray.  In and out catheter bid as needed. Encourage to use the bedpan or bedside commode.

## 2016-05-27 NOTE — Progress Notes (Signed)
Physical Therapy Treatment Patient Details Name: Kristen Mcdonald MRN: [id number] DOB: 02-07-1913 Today's Date: 05/27/2016    History of Present Illness Kristen Mcdonald is a 82 y.o. female. She presented to the hospital when she went outside to take out the trash and had a fall. She flagged down some people on the street and they carried her into her home. She was having severe pain in her left hip area. In the ER she was found to have a left fracture. No loss of consciousness with her fall. She lives independently at her home by herself and gets around pretty well. No complaints of chest pain. She does have some shortness of breath and she wears oxygen 24/ 7. Pt underwent L hemiarthroplasty posterior approach. Pt has fallen multiple times over the last 12 months. Pt is very hard of hearing. Grandson reports that pt will trip on O2 cannula. Niece present this date to provide some history and she reports grandson helps the patient often.     PT Comments    Pt sleeping but able to arouse with touch.  Pt pre-medicated however only stating "It hurts if I could only move my leg".  Pt repeatedly stating "I can't hear" but would respond appropriately to questions/comments made by PTA intermittently. After much encouragement and writing commands on paper pt performed heel slides, ankle pumps, hip abd.  Pt refused any attempts at [place] activities and became increasingly agitated when trying to reposition leg.  Returned leg to tucker brace and notified nsg.       Follow Up Recommendations  SNF     Equipment Recommendations  Other (comment) (TBD at facility )    Recommendations for Other Services       Precautions / Restrictions Precautions Precautions: Posterior Hip;Fall Restrictions Weight Bearing Restrictions: Yes LLE Weight Bearing: Partial weight bearing LLE Partial Weight Bearing Percentage or Pounds: 25-50%    Mobility  Bed Mobility Overal bed mobility: Needs Assistance              General bed mobility comments: refused with attempts this date  Transfers                 General transfer comment: refused and states firmly "Dont mess with me!"  Ambulation/Gait                 Stairs            Wheelchair Mobility    Modified Rankin (Stroke Patients Only)       Balance                                    Cognition Arousal/Alertness: Lethargic Behavior During Therapy: Agitated Overall Cognitive Status: Within Functional Limits for tasks assessed                 General Comments: Pt becoming increasingly adgitated throughout session     Exercises Total Joint Exercises Ankle Circles/Pumps: Strengthening;Both;15 reps;Supine Heel Slides: Strengthening;Left;10 reps;Supine Hip ABduction/ADduction: Strengthening;5 reps;Supine    General Comments        Pertinent Vitals/Pain Pain Assessment: 0-10 Pain Score: 7  (with movement) Pain Location: left hip Pain Descriptors / Indicators: Operative site guarding Pain Intervention(s): Limited activity within patient's tolerance;Repositioned;Monitored during session    Home Living Family/patient expects to be discharged to:: Skilled nursing facility Living Arrangements: Alone Available Help at Discharge: Family Type of Home: House Home Access: Stairs  to enter Entrance Stairs-Rails: Left Home Layout: One level Home Equipment: Toilet riser;Bedside commode;Walker - 4 wheels Additional Comments: Pt has life alert system. Transportation provided by family.  She gets meals on wheels.    Prior Function Level of Independence: Needs assistance  Gait / Transfers Assistance Needed: ambulates with single point cane for limited community mobility. Rollator around the house ADL's / Homemaking Assistance Needed: Independent with bathing/dressing. Assist for IADLs Comments: Pt on 2 L O2 at home   PT Goals (current goals can now be found in the care plan section) Acute Rehab PT  Goals Patient Stated Goal: To go home but is agreeable to SNF to get stronger, patient wants to be able to live alone again.    Frequency  BID    PT Plan Current plan remains appropriate    Co-evaluation             End of Session Equipment Utilized During Treatment: Oxygen Activity Tolerance: Patient limited by lethargy;Patient limited by pain;Treatment limited secondary to agitation Patient left: in bed;with bed alarm set     Time: [id number] PT Time Calculation (min) (ACUTE ONLY): 32 min  Charges:  $Therapeutic Exercise: 23-37 mins                    G Codes:      Laron Angelini 06-21-16, 12:25 PM  Annet Manukyan, PTA

## 2016-05-28 LAB — TYPE AND SCREEN
ABO/RH(D): A POS
Antibody Screen: NEGATIVE
UNIT DIVISION: 0

## 2016-05-28 LAB — CBC
HEMATOCRIT: 26.4 % — AB (ref 35.0–47.0)
Hemoglobin: 9.1 g/dL — ABNORMAL LOW (ref 12.0–16.0)
MCH: 30.2 pg (ref 26.0–34.0)
MCHC: 34.7 g/dL (ref 32.0–36.0)
MCV: 87.1 fL (ref 80.0–100.0)
Platelets: 164 10*3/uL (ref 150–440)
RBC: 3.03 MIL/uL — ABNORMAL LOW (ref 3.80–5.20)
RDW: 15.2 % — AB (ref 11.5–14.5)
WBC: 11.6 10*3/uL — ABNORMAL HIGH (ref 3.6–11.0)

## 2016-05-28 MED ORDER — FERROUS SULFATE 325 (65 FE) MG PO TABS: 325.0000 mg | ORAL_TABLET | Freq: Every day | ORAL | Status: AC

## 2016-05-28 MED ORDER — HYDROCODONE-ACETAMINOPHEN 5-325 MG PO TABS: 1.0000 | ORAL_TABLET | Freq: Four times a day (QID) | ORAL | Status: AC | PRN

## 2016-05-28 MED ORDER — FUROSEMIDE 40 MG PO TABS
20.0000 mg | ORAL_TABLET | Freq: Every day | ORAL | Status: DC
Start: 2016-05-28 — End: 2016-07-05

## 2016-05-28 MED ORDER — ENOXAPARIN SODIUM 30 MG/0.3ML ~~LOC~~ SOLN: 30.0000 mg | SUBCUTANEOUS | Status: DC

## 2016-05-28 NOTE — Progress Notes (Signed)
Physical Therapy Treatment Patient Details Name: Kristen Mcdonald MRN: [id number] DOB: [birth year] Today's Date: 05/28/2016    History of Present Illness Kristen Mcdonald is a 82 y.o. female. She presented to the hospital when she went outside to take out the trash and had a fall. She flagged down some people on the street and they carried her into her home. She was having severe pain in her left hip area. In the ER she was found to have a left fracture. No loss of consciousness with her fall. She lives independently at her home by herself and gets around pretty well. No complaints of chest pain. She does have some shortness of breath and she wears oxygen 24/ 7. Pt underwent L hemiarthroplasty posterior approach. Pt has fallen multiple times over the last 12 months. Pt is very hard of hearing. Grandson reports that pt will trip on O2 cannula. Niece present this date to provide some history and she reports grandson helps the patient often.     PT Comments    Patient is anxious about LLE exercise but is able to perform AAROM to LLE and THR exercises. She performs supine to sit bed mobility with max assist x 1 and is able to sit at the edge of the bed with BUE support. Patient has difficulty with treatment due to [facility] and LLE pain limitations.   Follow Up Recommendations  SNF     Equipment Recommendations       Recommendations for Other Services       Precautions / Restrictions Precautions Precautions: Posterior Hip Precaution Booklet Issued: Yes (comment) Restrictions Weight Bearing Restrictions: Yes LLE Weight Bearing: Partial weight bearing LLE Partial Weight Bearing Percentage or Pounds: 25-50%    Mobility  Bed Mobility Overal bed mobility: Needs Assistance Bed Mobility: Supine to Sit;Sit to Supine     Supine to sit: Max assist Sit to supine: Max assist   General bed mobility comments:  (Patient is able to perform sitting at the edge of the bed)  Transfers Overall transfer  level:  (unable to perform sit to stand)   Transfers:  (unable)              Ambulation/Gait                 Stairs            Wheelchair Mobility    Modified Rankin (Stroke Patients Only)       Balance Overall balance assessment: Modified Independent Sitting-balance support: Bilateral upper extremity supported Sitting balance-Leahy Scale: Fair                              Cognition Arousal/Alertness: Awake/alert (eyes closed during most of treatment) Behavior During Therapy: WFL for tasks assessed/performed Overall Cognitive Status: Within Functional Limits for tasks assessed Area of Impairment: Orientation Orientation Level: Place;Time             General Comments:  (Patient is HOH and it is difficult to instruct)    Exercises Total Joint Exercises Ankle Circles/Pumps: Strengthening;Left;10 reps Quad Sets: Strengthening;Left;5 reps Gluteal Sets: Strengthening;Both;5 reps Towel Squeeze: Strengthening;Both;Supine;15 reps;Other (comment) Short Arc Quad: Strengthening;5 reps;Supine Heel Slides: Strengthening;Left;5 reps Hip ABduction/ADduction: Strengthening;5 reps;Supine Straight Leg Raises: Strengthening;Left;Supine;15 reps    General Comments        Pertinent Vitals/Pain Pain Assessment: Faces Pain Score: 5  Pain Location: left hip Pain Descriptors / Indicators: Aching Pain Intervention(s): Limited activity within patient's  tolerance    Home Living                      Prior Function            PT Goals (current goals can now be found in the care plan section) Acute Rehab PT Goals Patient Stated Goal: To go home but is agreeable to SNF to get stronger, patient wants to be able to live alone again. PT Goal Formulation: With patient Time For Goal Achievement: 06/09/16 Potential to Achieve Goals: Fair    Frequency  BID    PT Plan Current plan remains appropriate    Co-evaluation             End  of Session Equipment Utilized During Treatment: Oxygen Activity Tolerance: Patient limited by pain Patient left: in bed;with bed alarm set     Time: 0720-0745 PT Time Calculation (min) (ACUTE ONLY): 25 min  Charges:  $Therapeutic Exercise: 23-37 mins                    G Codes:     Alanson Puls, PT, DPT [place], Minette [place] S 05/28/2016, 8:59 AM

## 2016-05-28 NOTE — Discharge Summary (Signed)
[place] [facility] [place] NAME: Kristen Mcdonald    MR#:  [id number]  DATE OF BIRTH:  02-19-1913  DATE OF ADMISSION:  05/24/2016 ADMITTING PHYSICIAN: Earnestine Leys, MD  DATE OF DISCHARGE: 05/28/2016  PRIMARY CARE PHYSICIAN: Pcp Not In System    ADMISSION DIAGNOSIS:  Hip fracture, left, closed, initial encounter ([place]) [S72.002A]  DISCHARGE DIAGNOSIS:  Left hip fracture s/p surgery Impaired hearing Postop anemia status post 1 unit blood transfusion  SECONDARY DIAGNOSIS:   Past Medical History  Diagnosis Date  . CHF (congestive heart failure) ([place])   . Thyroid disease   . GERD (gastroesophageal reflux disease)   . Anemia   . COPD (chronic obstructive pulmonary disease) ([place])   . Arthritis   . Heart attack ([place])   . Cancer [facility])     HOSPITAL COURSE:  1. Acute left hip fracture closed,Due to mechanical fall. Patient is status post surgery day 2. - No contraindications to surgery at this time. -10.8--8.8--7.9--1 unit blood transfusion---9.1 -spoke with Dr Raliegh Ip.   2. COPD with chronic respiratory failure on chronic oxygen. Respiratory status currently stable. 3. History of congestive heart failure. Currently no signs of heart failure and her mother actually starting fluids for low blood pressure. 4. Relative hypotension. Pt asymptomatic (she likely stays low with her BP)  5. History of arrhythmia on amiodarone  6. Hypothyroidism unspecified on levothyroxine  7. Hyperlipidemia unspecified on Lipitor  8. DVT prophylaxis per orthopedic. Lovenox for 14 days  D/c to rehab CONSULTS OBTAINED:  Treatment Team:  Earnestine Leys, MD Fritzi Mandes, MD  DRUG ALLERGIES:  No Known Allergies  DISCHARGE MEDICATIONS:   Current Discharge Medication List    START taking these medications   Details  enoxaparin (LOVENOX) 30 MG/0.3ML injection Inject 0.3 mLs (30 mg total) into the skin daily. Qty: 14 Syringe, Refills: 0    ferrous sulfate  325 (65 FE) MG tablet Take 1 tablet (325 mg total) by mouth daily with breakfast. Qty: 30 tablet, Refills: 3    HYDROcodone-acetaminophen (NORCO/VICODIN) 5-325 MG tablet Take 1-2 tablets by mouth every 6 (six) hours as needed for moderate pain. Qty: 30 tablet, Refills: 0      CONTINUE these medications which have CHANGED   Details  furosemide (LASIX) 40 MG tablet Take 0.5 tablets (20 mg total) by mouth daily. Pt is able to take an additional tablet daily if needed for edema. Qty: 30 tablet, Refills: 1      CONTINUE these medications which have NOT CHANGED   Details  amiodarone (PACERONE) 200 MG tablet Take 200 mg by mouth daily.    aspirin EC 81 MG tablet Take 81 mg by mouth daily.    atorvastatin (LIPITOR) 10 MG tablet Take 10 mg by mouth daily.    calcium-vitamin D (OSCAL WITH D) 500-200 MG-UNIT tablet Take 1 tablet by mouth 2 (two) times daily.    guaiFENesin (ROBITUSSIN) 100 MG/5ML SOLN Take 5 mLs (100 mg total) by mouth every 4 (four) hours as needed for cough or to loosen phlegm. Qty: 1200 mL, Refills: 0    levothyroxine (SYNTHROID, LEVOTHROID) 88 MCG tablet Take 88 mcg by mouth daily before breakfast.    Multiple Vitamin (MULTIVITAMIN WITH MINERALS) TABS tablet Take 1 tablet by mouth daily.    potassium chloride (K-DUR,KLOR-CON) 10 MEQ tablet Take 10 mEq by mouth daily.    ranitidine (ZANTAC) 150 MG tablet Take 150 mg by mouth 2 (two) times daily.    solifenacin (  VESICARE) 5 MG tablet Take 5 mg by mouth daily.        If you experience worsening of your admission symptoms, develop shortness of breath, life threatening emergency, suicidal or homicidal thoughts you must seek medical attention immediately by calling 911 or calling your MD immediately  if symptoms less severe.  You Must read complete instructions/literature along with all the possible adverse reactions/side effects for all the Medicines you take and that have been prescribed to you. Take any new Medicines  after you have completely understood and accept all the possible adverse reactions/side effects.   Please note  You were cared for by a hospitalist during your hospital stay. If you have any questions about your discharge medications or the care you received while you were in the hospital after you are discharged, you can call the unit and asked to speak with the hospitalist on call if the hospitalist that took care of you is not available. Once you are discharged, your primary care physician will handle any further medical issues. Please note that NO REFILLS for any discharge medications will be authorized once you are discharged, as it is imperative that you return to your primary care physician (or establish a relationship with a primary care physician if you do not have one) for your aftercare needs so that they can reassess your need for medications and monitor your lab values. Today   SUBJECTIVE   No new issues other than intermittent need for in and out cath  VITAL SIGNS:  Blood pressure 98/55, pulse 69, temperature 99.6 F (37.6 C), temperature source Oral, resp. rate 20, weight 63.912 kg (140 lb 14.4 oz), SpO2 99 %.  I/O:   Intake/Output Summary (Last 24 hours) at 05/28/16 0728 Last data filed at 05/27/16 2100  Gross per 24 hour  Intake    700 ml  Output   1300 ml  Net   -600 ml    PHYSICAL EXAMINATION:  GENERAL:  82 y.o.-year-old patient lying in the bed with no acute distress.  EYES: Pupils equal, round, reactive to light and accommodation. No scleral icterus. Extraocular muscles intact.  HEENT: Head atraumatic, normocephalic. Oropharynx and nasopharynx clear.  NECK:  Supple, no jugular venous distention. No thyroid enlargement, no tenderness.  LUNGS: Normal breath sounds bilaterally, no wheezing, rales,rhonchi or crepitation. No use of accessory muscles of respiration.  CARDIOVASCULAR: S1, S2 normal. No murmurs, rubs, or gallops.  ABDOMEN: Soft, non-tender, non-distended.  Bowel sounds present. No organomegaly or mass.  EXTREMITIES: No pedal edema, cyanosis, or clubbing.  NEUROLOGIC: Cranial nerves II through XII are intact. Muscle strength 5/5 in all extremities. Sensation intact. Gait not checked.  PSYCHIATRIC: The patient is alert and oriented x 3.  SKIN: No obvious rash, lesion, or ulcer.   DATA REVIEW:   CBC   Recent Labs Lab 05/28/16 0424  WBC 11.6*  HGB 9.1*  HCT 26.4*  PLT 164    Chemistries   Recent Labs Lab 05/27/16 0546  NA 137  K 4.0  CL 107  CO2 26  GLUCOSE 154*  BUN 22*  CREATININE 1.14*  CALCIUM 8.2*    Microbiology Results   Recent Results (from the past 240 hour(s))  Surgical PCR screen     Status: Abnormal   Collection Time: 05/25/16  2:00 AM  Result Value Ref Range Status   MRSA, PCR POSITIVE (A) NEGATIVE Final    Comment: CRITICAL RESULT CALLED TO, READ BACK BY AND VERIFIED WITH: TRACY TOOMBS @ 0335 ON  05/25/2016 BY CAF    Staphylococcus aureus POSITIVE (A) NEGATIVE Final    Comment:        The Xpert SA Assay (FDA approved for NASAL specimens in patients over 66 years of age), is one component of a comprehensive surveillance program.  Test performance has been validated by [facility] for patients greater than or equal to 68 year old. It is not intended to diagnose infection nor to guide or monitor treatment.     RADIOLOGY:  No results found.   Management plans discussed with the patient, family and they are in agreement.  CODE STATUS:  Code Status History    Date Active Date Inactive Code Status Order ID Comments User Context   05/24/2016  6:59 PM 05/25/2016  3:51 PM Full Code [id number]  Earnestine Leys, MD ED   02/04/2016  5:36 PM 02/08/2016  7:30 PM Full Code [id number]  Demetrios Loll, MD Inpatient    Advance Directive Documentation        Most Recent Value   Type of Advance Directive  Healthcare Power of Attorney, Out of facility DNR (pink MOST or yellow form)   Pre-existing out of facility DNR order  (yellow form or pink MOST form)     "MOST" Form in Place?        TOTAL TIME TAKING CARE OF THIS PATIENT: *31minutes.    Deniese Oberry M.D on 05/28/2016 at 7:28 AM  Between 7am to 6pm - Pager - [phone] After 6pm go to [email or web address] - password [email or web address] [facility]  Office  [phone]  CC: Primary care physician; Pcp Not In System

## 2016-05-28 NOTE — Progress Notes (Signed)
  Subjective:  Patient is confused, but denies having significant left hip pain. She feels that she needs to use the commode.  Objective:   VITALS:   Filed Vitals:   05/27/16 1848 05/27/16 2021 05/28/16 0430 05/28/16 0817  BP: 143/111 99/51 98/55 108/94  Pulse: 100 72 69 70  Temp:  99.1 F (37.3 C) 99.6 F (37.6 C) 97.6 F (36.4 C)  TempSrc:  Oral Oral Oral  Resp:  18 20 20   Weight:      SpO2:  97% 99% 99%    PHYSICAL EXAM:  Left lower extremity: Patient's dressing was changed by me. The incision was clean dry and intact. There is no active drainage. The remainder of her exam demonstrates Neurovascular intact Sensation intact distally Intact pulses distally Dorsiflexion/Plantar flexion intact Incision: no drainage No cellulitis present Compartment soft  LABS  Results for orders placed or performed during the hospital encounter of 05/24/16 (from the past 24 hour(s))  Prepare RBC     Status: None   Collection Time: 05/27/16 12:24 PM  Result Value Ref Range   Order Confirmation ORDER PROCESSED BY BLOOD BANK   CBC     Status: Abnormal   Collection Time: 05/28/16  4:24 AM  Result Value Ref Range   WBC 11.6 (H) 3.6 - 11.0 K/uL   RBC 3.03 (L) 3.80 - 5.20 MIL/uL   Hemoglobin 9.1 (L) 12.0 - 16.0 g/dL   HCT 26.4 (L) 35.0 - 47.0 %   MCV 87.1 80.0 - 100.0 fL   MCH 30.2 26.0 - 34.0 pg   MCHC 34.7 32.0 - 36.0 g/dL   RDW 15.2 (H) 11.5 - 14.5 %   Platelets 164 150 - 440 K/uL    No results found.  Assessment/Plan: 3 Days Post-Op   Active Problems:   Hip fracture [facility])   Patient will continue physical therapy. She is stable from an orthopedic standpoint he may be discharged to skilled nursing facility when cleared by medicine. She'll follow up with Dr. Sabra Heck in the office in 7-10 days. Patient will remain on posterior hip precautions. She will use a walker for assistance with ambulation and be partial weightbearing 4 weeks postop. Patient should remain on Lovenox for DVT  prophylaxis while in skilled nursing facility.   Thornton Park , MD 05/28/2016, 10:10 AM

## 2016-05-28 NOTE — Progress Notes (Signed)
Called report to [place] at [employer].

## 2016-05-28 NOTE — Progress Notes (Signed)
Clinical Social Worker was informed that patient will be medically ready to discharge to [facility]. Patient and family are in a agreement with plan. CSW called Broadus John- Admissions Coordinator at [facility] to confirm that patient's bed is ready. Provided patient's room number 206 and number to call for report [address] nurse [phone] . All discharge information faxed to [facility] via [facility]. RN will call report and patient will discharge to [facility] via EMS.  Ernest Pine, MSW, [place], [place] Clinical Social Worker [phone]

## 2016-05-28 NOTE — Progress Notes (Signed)
Patient being discharged to [facility]. IV removed, belongings packed and patient medicated for pain. Nurse tech to prepare patient for transport. Family bedside.

## 2016-05-28 NOTE — Clinical Social Work Placement (Signed)
   CLINICAL SOCIAL WORK PLACEMENT  NOTE  Date:  05/28/2016  Patient Details  Name: Kristen Mcdonald MRN: [id number] Date of Birth: 07-02-1913  Clinical Social Work is seeking post-discharge placement for this patient at the [place] level of care (*CSW will initial, date and re-position this form in  chart as items are completed):  Yes   Patient/family provided with [place] Work Department's list of facilities offering this level of care within the geographic area requested by the patient (or if unable, by the patient's family).  Yes   Patient/family informed of their freedom to choose among providers that offer the needed level of care, that participate in Medicare, Medicaid or managed care program needed by the patient, have an available bed and are willing to accept the patient.  Yes   Patient/family informed of [facility]'s ownership interest in [facility] and [facility], as well as of the fact that they are under no obligation to receive care at these facilities.  PASRR submitted to EDS on       PASRR number received on       Existing PASRR number confirmed on 05/25/16     FL2 transmitted to all facilities in geographic area requested by pt/family on 05/25/16     FL2 transmitted to all facilities within larger geographic area on       Patient informed that his/her managed care company has contracts with or will negotiate with certain facilities, including the following:        Yes   Patient/family informed of bed offers received.  Patient chooses bed at  ([facility])     Physician recommends and patient chooses bed at      Patient to be transferred to  ([facility]) on 05/28/16.  Patient to be transferred to facility by  (EMS)     Patient family notified on 05/28/16 of transfer.  Name of family member notified:   (Niece- Barbara)     PHYSICIAN       Additional Comment:    _______________________________________________ Baldemar Lenis, LCSW 05/28/2016, 9:54 AM

## 2016-07-05 ENCOUNTER — Encounter: Payer: Self-pay | Admitting: Emergency Medicine

## 2016-07-05 ENCOUNTER — Emergency Department
Admission: EM | Admit: 2016-07-05 | Discharge: 2016-07-05 | Disposition: A | Discharge status: Home / Self Care | Payer: Medicare Other | Attending: Emergency Medicine | Admitting: Emergency Medicine

## 2016-07-05 ENCOUNTER — Emergency Department: Payer: Medicare Other

## 2016-07-05 DIAGNOSIS — Y999 Unspecified external cause status: Secondary | ICD-10-CM | POA: Diagnosis not present

## 2016-07-05 DIAGNOSIS — S0003XA Contusion of scalp, initial encounter: Secondary | ICD-10-CM

## 2016-07-05 DIAGNOSIS — Z7982 Long term (current) use of aspirin: Secondary | ICD-10-CM | POA: Insufficient documentation

## 2016-07-05 DIAGNOSIS — W1800XA Striking against unspecified object with subsequent fall, initial encounter: Secondary | ICD-10-CM | POA: Insufficient documentation

## 2016-07-05 DIAGNOSIS — S0990XA Unspecified injury of head, initial encounter: Secondary | ICD-10-CM | POA: Diagnosis present

## 2016-07-05 DIAGNOSIS — I509 Heart failure, unspecified: Secondary | ICD-10-CM | POA: Diagnosis not present

## 2016-07-05 DIAGNOSIS — Z79899 Other long term (current) drug therapy: Secondary | ICD-10-CM | POA: Diagnosis not present

## 2016-07-05 DIAGNOSIS — J449 Chronic obstructive pulmonary disease, unspecified: Secondary | ICD-10-CM | POA: Diagnosis not present

## 2016-07-05 DIAGNOSIS — Z791 Long term (current) use of non-steroidal anti-inflammatories (NSAID): Secondary | ICD-10-CM | POA: Diagnosis not present

## 2016-07-05 DIAGNOSIS — Z859 Personal history of malignant neoplasm, unspecified: Secondary | ICD-10-CM | POA: Diagnosis not present

## 2016-07-05 DIAGNOSIS — Y929 Unspecified place or not applicable: Secondary | ICD-10-CM | POA: Insufficient documentation

## 2016-07-05 DIAGNOSIS — S0101XA Laceration without foreign body of scalp, initial encounter: Secondary | ICD-10-CM

## 2016-07-05 DIAGNOSIS — W19XXXA Unspecified fall, initial encounter: Secondary | ICD-10-CM

## 2016-07-05 DIAGNOSIS — Y939 Activity, unspecified: Secondary | ICD-10-CM | POA: Insufficient documentation

## 2016-07-05 LAB — URINALYSIS COMPLETE WITH MICROSCOPIC (ARMC ONLY)
BILIRUBIN URINE: NEGATIVE
Bacteria, UA: NONE SEEN
GLUCOSE, UA: NEGATIVE mg/dL
KETONES UR: NEGATIVE mg/dL
Nitrite: NEGATIVE
PH: 7 (ref 5.0–8.0)
Protein, ur: NEGATIVE mg/dL
Specific Gravity, Urine: 1.008 (ref 1.005–1.030)

## 2016-07-05 LAB — BASIC METABOLIC PANEL
ANION GAP: 6 (ref 5–15)
BUN: 13 mg/dL (ref 6–20)
CHLORIDE: 106 mmol/L (ref 101–111)
CO2: 30 mmol/L (ref 22–32)
CREATININE: 0.94 mg/dL (ref 0.44–1.00)
Calcium: 8.9 mg/dL (ref 8.9–10.3)
GFR calc non Af Amer: 48 mL/min — ABNORMAL LOW (ref >60)
GFR, EST AFRICAN AMERICAN: 56 mL/min — AB (ref >60)
Glucose, Bld: 98 mg/dL (ref 65–99)
Potassium: 3.7 mmol/L (ref 3.5–5.1)
Sodium: 142 mmol/L (ref 135–145)

## 2016-07-05 LAB — CBC WITH DIFFERENTIAL/PLATELET
BASOS ABS: 0.1 10*3/uL (ref 0–0.1)
BASOS PCT: 1 %
EOS ABS: 0.2 10*3/uL (ref 0–0.7)
EOS PCT: 3 %
HCT: 30.8 % — ABNORMAL LOW (ref 35.0–47.0)
HEMOGLOBIN: 10.4 g/dL — AB (ref 12.0–16.0)
LYMPHS PCT: 16 %
Lymphs Abs: 1.1 10*3/uL (ref 1.0–3.6)
MCH: 30 pg (ref 26.0–34.0)
MCHC: 33.7 g/dL (ref 32.0–36.0)
MCV: 89.1 fL (ref 80.0–100.0)
Monocytes Absolute: 0.7 10*3/uL (ref 0.2–0.9)
Monocytes Relative: 11 %
NEUTROS ABS: 4.9 10*3/uL (ref 1.4–6.5)
NEUTROS PCT: 69 %
PLATELETS: 206 10*3/uL (ref 150–440)
RBC: 3.46 MIL/uL — AB (ref 3.80–5.20)
RDW: 15.5 % — ABNORMAL HIGH (ref 11.5–14.5)
WBC: 7.1 10*3/uL (ref 3.6–11.0)

## 2016-07-05 LAB — TROPONIN I: TROPONIN I: 0.03 ng/mL — AB (ref <0.03)

## 2016-07-05 NOTE — ED Provider Notes (Signed)
[facility] Emergency Department Provider Note ____________________________________________  Time seen: Approximately 8:35 AM I have reviewed the triage vital signs and the triage nursing note.  HISTORY  Chief Complaint Fall and Head Injury   Historian Patient and grandson  HPI TYA LISANTI is a 82 y.o. female who is at [facility] after history of hip fractures, patient states that she was getting up to turn on the heat because it was cold, and she fell backwards. She states that she always falls straight backwards. No reported lightheadedness, focal weakness or numbness, chest pain, palpitations, trouble breathing.  Yolanda Bonine states that she was just treated for a UTI recently. Patient denies abdominal pain, or dysuria herself.  Mild pain in her neck. Moderate pain at the back of her head where there is hematoma. No reported loss of consciousness. Denies extremity or back or abdomen or chest pain.    Past Medical History:  Diagnosis Date  . Anemia   . Arthritis   . Cancer ([place])   . CHF (congestive heart failure) ([place])   . COPD (chronic obstructive pulmonary disease) ([place])   . GERD (gastroesophageal reflux disease)   . Heart attack ([place])   . Thyroid disease     Patient Active Problem List   Diagnosis Date Noted  . Hip fracture ([place]) 05/25/2016  . Pneumonia 02/04/2016    Past Surgical History:  Procedure Laterality Date  . abdominal blockage    . ABDOMINAL HYSTERECTOMY    . APPENDECTOMY    . CORONARY ANGIOPLASTY WITH STENT PLACEMENT    . HIP ARTHROPLASTY Left 05/25/2016   Procedure: ARTHROPLASTY BIPOLAR HIP (HEMIARTHROPLASTY);  Surgeon: Earnestine Leys, MD;  Location: [facility] ORS;  Service: Orthopedics;  Laterality: Left;  Marland Kitchen MASTECTOMY    . right hip surgery    . VESICOVAGINAL FISTULA CLOSURE W/ TAH      Prior to Admission medications   Medication Sig Start Date End Date Taking? Authorizing Provider  ALPRAZolam (XANAX) 0.25 MG tablet Take  0.25 mg by mouth every 8 (eight) hours as needed for anxiety.   Yes Historical Provider, MD  amiodarone (PACERONE) 200 MG tablet Take 200 mg by mouth daily.   Yes Historical Provider, MD  aspirin EC 81 MG tablet Take 81 mg by mouth daily.   Yes Historical Provider, MD  atorvastatin (LIPITOR) 10 MG tablet Take 10 mg by mouth daily.   Yes Historical Provider, MD  calcium-vitamin D (OSCAL WITH D) 500-200 MG-UNIT tablet Take 1 tablet by mouth 2 (two) times daily.   Yes Historical Provider, MD  ferrous sulfate 325 (65 FE) MG tablet Take 1 tablet (325 mg total) by mouth daily with breakfast. Patient taking differently: Take 325 mg by mouth 2 (two) times daily with a meal.  05/28/16  Yes Fritzi Mandes, MD  furosemide (LASIX) 20 MG tablet Take 20 mg by mouth daily.   Yes Historical Provider, MD  furosemide (LASIX) 20 MG tablet Take 20 mg by mouth daily as needed for edema.   Yes Historical Provider, MD  guaiFENesin (ROBITUSSIN) 100 MG/5ML SOLN Take 5 mLs (100 mg total) by mouth every 4 (four) hours as needed for cough or to loosen phlegm. 02/08/16  Yes Dustin Flock, MD  HYDROcodone-acetaminophen (NORCO/VICODIN) 5-325 MG tablet Take 1-2 tablets by mouth every 6 (six) hours as needed for moderate pain. 05/28/16  Yes Fritzi Mandes, MD  ipratropium-albuterol (DUONEB) 0.5-2.5 (3) MG/3ML SOLN Take 3 mLs by nebulization every 4 (four) hours as needed.   Yes Historical  Provider, MD  levothyroxine (SYNTHROID, LEVOTHROID) 100 MCG tablet Take 100 mcg by mouth daily before breakfast.   Yes Historical Provider, MD  Multiple Vitamin (MULTIVITAMIN WITH MINERALS) TABS tablet Take 1 tablet by mouth daily.   Yes Historical Provider, MD  polyethylene glycol (MIRALAX / GLYCOLAX) packet Take 17 g by mouth 2 (two) times daily.   Yes Historical Provider, MD  potassium chloride (K-DUR,KLOR-CON) 10 MEQ tablet Take 10 mEq by mouth daily.   Yes Historical Provider, MD  promethazine (PHENERGAN) 12.5 MG tablet Take 12.5 mg by mouth every 6 (six)  hours as needed for nausea or vomiting.   Yes Historical Provider, MD  ranitidine (ZANTAC) 150 MG tablet Take 150 mg by mouth 2 (two) times daily.   Yes Historical Provider, MD  solifenacin (VESICARE) 5 MG tablet Take 5 mg by mouth daily.   Yes Historical Provider, MD  vitamin B-12 (CYANOCOBALAMIN) 1000 MCG tablet Take 1,000 mcg by mouth daily.   Yes Historical Provider, MD    No Known Allergies  Family History  Problem Relation Age of Onset  . Stroke Mother   . Cancer Father     Social History Social History  Substance Use Topics  . Smoking status: Never Smoker  . Smokeless tobacco: Never Used  . Alcohol use No    Review of Systems  Constitutional: Negative for fever. Eyes: Negative for visual changes. ENT: Negative for sore throat. Cardiovascular: Negative for chest pain. Respiratory: Negative for shortness of breath. Gastrointestinal: Negative for abdominal pain, vomiting and diarrhea. Genitourinary: Negative for dysuria. Musculoskeletal: Negative for back pain. Skin: Negative for rash. Neurological: Positive for headache. 10 point Review of Systems otherwise negative ____________________________________________   PHYSICAL EXAM:  VITAL SIGNS: ED Triage Vitals [07/05/16 0832]  Enc Vitals Group     BP 118/86     Pulse Rate 67     Resp 20     Temp 98.1 F (36.7 C)     Temp Source Oral     SpO2 100 %     Weight 140 lb (63.5 kg)     Height      Head Circumference      [place]      Pain Score      Pain Loc      Pain Edu?      Excl. in [place]?      Constitutional: Alert and oriented. Well appearing and in no distress. HEENT   Head: Fairly large posterior scalp hematoma with scalp laceration.      Eyes: Conjunctivae are normal. PERRL. Normal extraocular movements.      Ears:         Nose: No congestion/rhinnorhea. Wearing home oxygen   Mouth/Throat: Mucous membranes are moist.   Neck: No stridor.  No posterior step-off, some tenderness of the  cervical spine on palpation. Cardiovascular/Chest: Normal rate, regular rhythm.  No murmurs, rubs, or gallops. Respiratory: Normal respiratory effort without tachypnea nor retractions. Breath sounds are clear and equal bilaterally. No wheezes/rales/rhonchi. Gastrointestinal: Soft. No distention, no guarding, no rebound. Nontender.   Genitourinary/rectal:Deferred Musculoskeletal: Pelvis stable. Nontender range of motion of the hips and all 4 extremities and joints. No pain on palpation of the thoracic or lumbar spine.  Neurologic:  Normal speech and language. No gross or focal neurologic deficits are appreciated. Skin:  Skin is warm, dry and intact. No rash noted. Psychiatric: Mood and affect are normal. Speech and behavior are normal. Patient exhibits appropriate insight and judgment.  ____________________________________________  EKG I, Lisa Roca, MD, the attending physician have personally viewed and interpreted all ECGs.  Saw tooth pattern consistent with Atrial flutter. Heart rate appears to be around 70.  Undetermined axis due to significant underlying sawtooth pattern. Nonspecific intraventricular conduction delay. Due to underlying baseline, difficult to visualize ST segment and T waves in all leads, however no STEMI visualized.. ____________________________________________  LABS (pertinent positives/negatives)  Labs Reviewed  BASIC METABOLIC PANEL - Abnormal; Notable for the following:       Result Value   GFR calc non Af Amer 48 (*)    GFR calc Af Amer 56 (*)    All other components within normal limits  CBC WITH DIFFERENTIAL/PLATELET - Abnormal; Notable for the following:    RBC 3.46 (*)    Hemoglobin 10.4 (*)    HCT 30.8 (*)    RDW 15.5 (*)    All other components within normal limits  TROPONIN I - Abnormal; Notable for the following:    Troponin I 0.03 (*)    All other components within normal limits  URINALYSIS COMPLETEWITH MICROSCOPIC ([facility] ONLY) - Abnormal;  Notable for the following:    Color, Urine YELLOW (*)    APPearance HAZY (*)    Hgb urine dipstick 1+ (*)    Leukocytes, UA TRACE (*)    Squamous Epithelial / LPF 0-5 (*)    All other components within normal limits    ____________________________________________  RADIOLOGY All Xrays were viewed by me. Imaging interpreted by Radiologist.  CT head and cervical spine without contrast: IMPRESSION: Head CT:  No CT evidence of acute intracranial abnormality.  Two regions of soft tissue swelling/ scalp hematoma, 1 in the left parietal region and 1 in the occipital region. Surgical staples in the soft tissues of the occipital injury. No associated skull fracture.  Mild white matter disease with intracranial atherosclerosis.  Cervical CT:  No CT evidence of acute fracture or malalignment of the cervical spine.  Multilevel degenerative disc changes.  Carotid atherosclerosis.  Nonspecific right thyroid cyst. __________________________________________  PROCEDURES  Procedure(s) performed: LACERATION REPAIR Performed by: Lisa Roca Authorized by: Lisa Roca Consent: Verbal consent obtained. Risks and benefits: risks, benefits and alternatives were discussed Consent given by: patient Patient identity confirmed: provided demographic data  Wound explored  Laceration Location: Posterior scalp  Laceration Length: 5 cm  No Foreign Bodies seen or palpated  Skin closure: 6 Staples 2 superficial laceration    Patient tolerance: Patient tolerated the procedure well with no immediate complications.  Critical Care performed: None  ____________________________________________   ED COURSE / ASSESSMENT AND PLAN  Pertinent labs & imaging results that were available during my care of the patient were reviewed by me and considered in my medical decision making (see chart for details).   Ms. Molina is here after a fall which sounds like it was consistent with her  prior falls, some combination between off balance and mechanical. No additional symptoms that have a highly concerned for syncope or cardiac emergency. Yolanda Bonine does state that she is recently been treated for urinary tract infection, and we discussed and chose to go ahead and check laboratory studies and a urinalysis here.  From a traumatic perspective, she does not appear to have any injuries of her chest, abdomen, back, or extremities. Staples were placed in her scalp laceration.   CT head and cervical spine without intracranial abnormalities or cervical spine traumatic injury. C-collar was removed by nurse with my permission.  Additional workup/evaluation is reassuring.  I discussed with patient and the family member the risk of delayed bleeding, and to certainly watch for any changes of altered mental status, or other neurologic symptoms.    CONSULTATIONS:   None   Patient / Family / Caregiver informed of clinical course, medical decision-making process, and agree with plan.   I discussed return precautions, follow-up instructions, and discharged instructions with patient and/or family.   ___________________________________________   FINAL CLINICAL IMPRESSION(S) / ED DIAGNOSES   Final diagnoses:  Hematoma of scalp, initial encounter  Scalp laceration, initial encounter  Fall, initial encounter              Note: This dictation was prepared with Dragon dictation. Any transcriptional errors that result from this process are unintentional    Lisa Roca, MD 07/05/16 1134

## 2016-07-05 NOTE — ED Notes (Signed)
Dr Reita Cliche placed 6 staples to the back of pt head.  Pt tolerated well.

## 2016-07-05 NOTE — ED Notes (Signed)
C-collar applied to pt.

## 2016-07-05 NOTE — ED Triage Notes (Signed)
Pt to ed with c/o fall this am at [facility].  Pt resides there post hip fx repair.  Pt is able to ambulate prior to fall.  Pt alert but confused to situation, place, time.  Pt with laceration noted to back of head, bleeding controlled at this time.  Pt denies loss of consciousness after fall,  Pt states she was cold and was trying to get up and turn up the heat when she fell.

## 2016-07-05 NOTE — Discharge Instructions (Signed)
Staples need to be removed in about 14 days, and this may be done by your primary care physician's office.  Return to the emergency department for any worsening condition including any altered mental status, seizure, weakness, numbness, or any other troubles that develop in terms of pain, or trouble breathing.

## 2016-07-05 NOTE — ED Notes (Signed)
Pt returned from CT scan. Family remains at bedside.  

## 2016-07-05 NOTE — ED Notes (Signed)
EMS here to transport patient. Patient unable to sign discharge form due to dementia.

## 2016-07-05 NOTE — ED Notes (Signed)
Waiting on ems transport.  °

## 2016-12-17 IMAGING — CT CT ABD-PELV W/O CM
2 of 4 series · 17 of 46 positions shown, 19 images · non-contrast
Comparison: None.

CLINICAL DATA: Abdominal pain radiating to back.

EXAM:
CT ABDOMEN AND PELVIS WITHOUT CONTRAST
TECHNIQUE: Multidetector CT imaging of the abdomen and pelvis was performed
following the standard protocol without IV contrast.

[Series 2: routine abd pel without · axial · non-contrast · 0.83mm/px · z∈[-408,-8]mm · 14 of 88 slices shown, 16 images]
[im 4/88  soft-tissue]
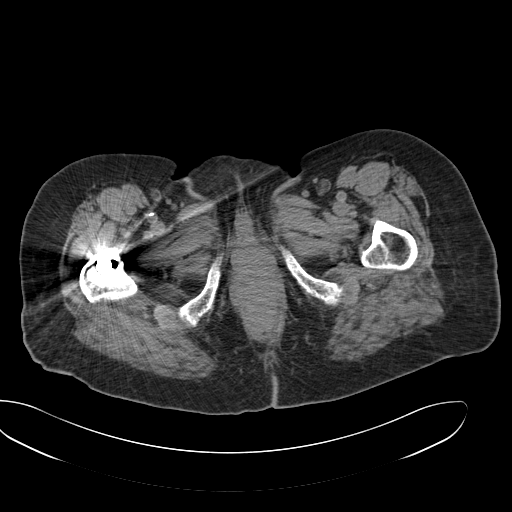
[im 4/88  bone]
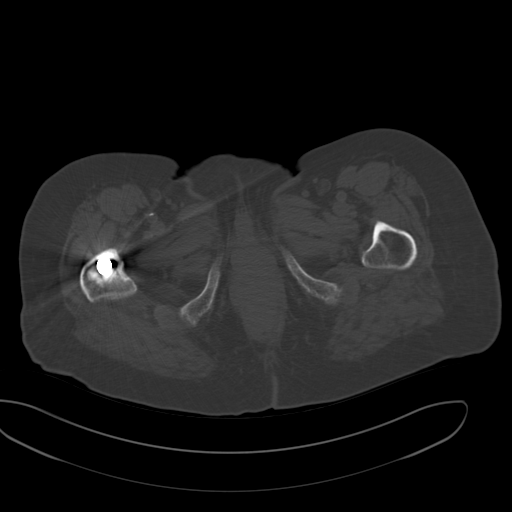
[im 11/88  soft-tissue]
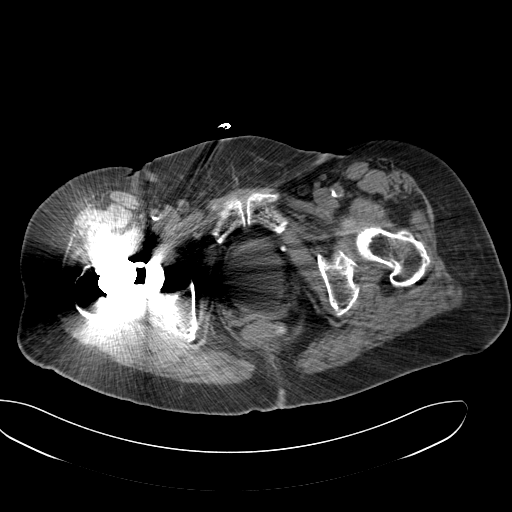
[im 15/88  soft-tissue]
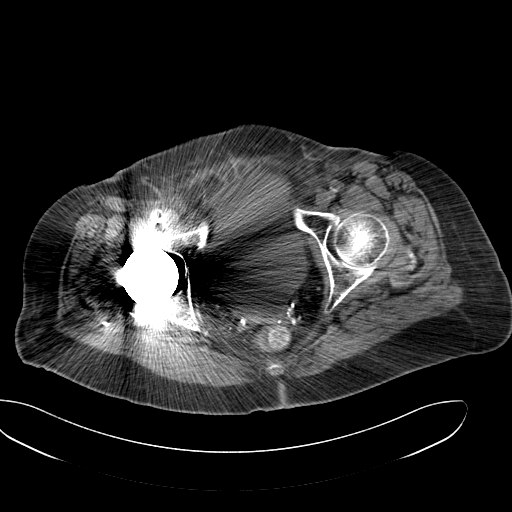
[im 26/88  soft-tissue]
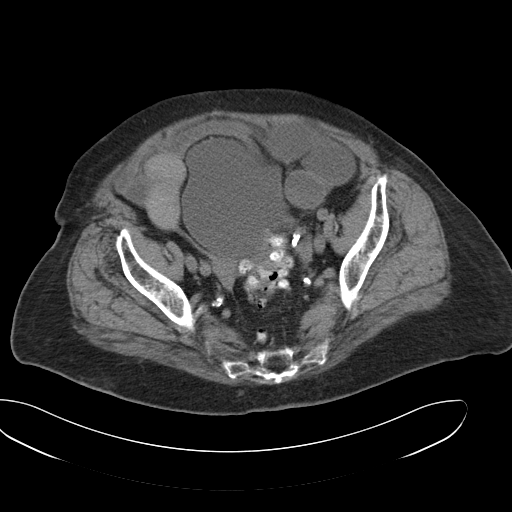
[im 33/88  soft-tissue]
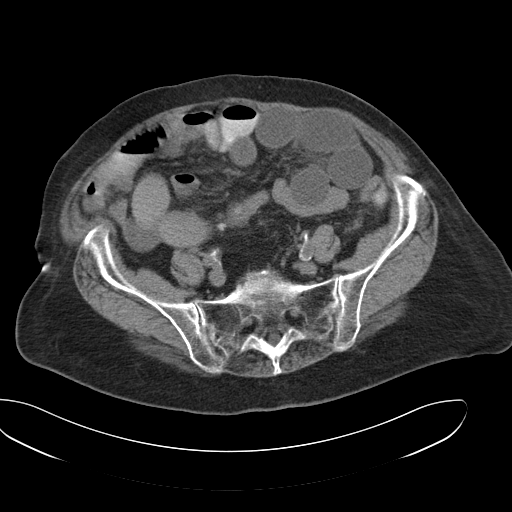
[im 37/88  soft-tissue]
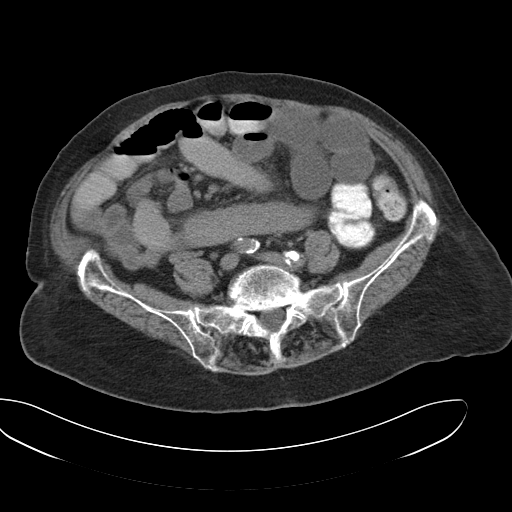
[im 44/88  soft-tissue]
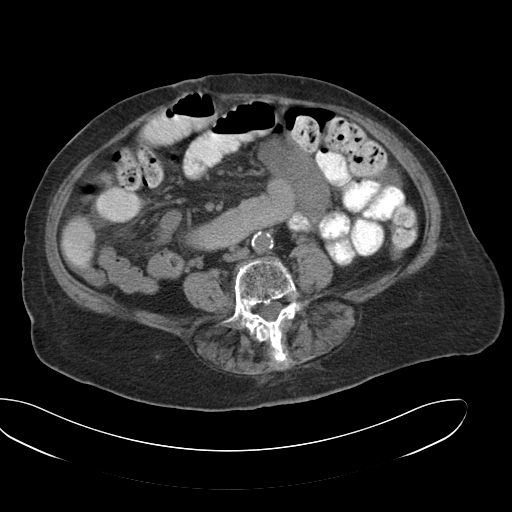
[im 48/88  soft-tissue]
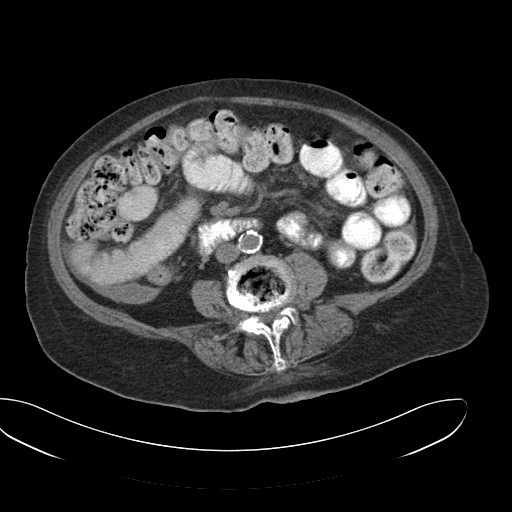
[im 55/88  soft-tissue]
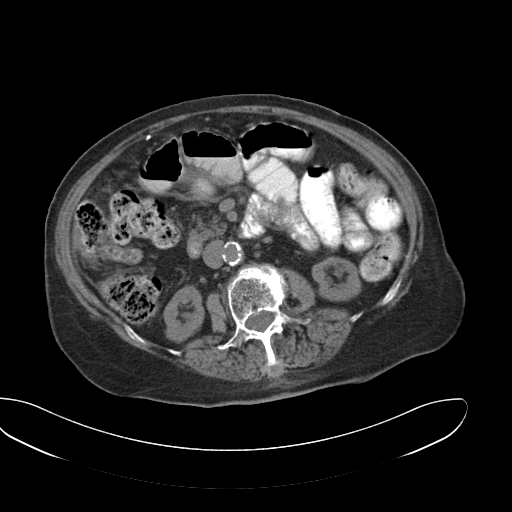
[im 55/88  bone]
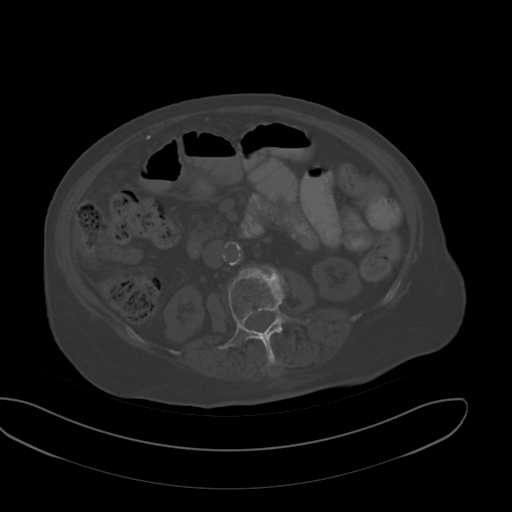
[im 59/88  soft-tissue]
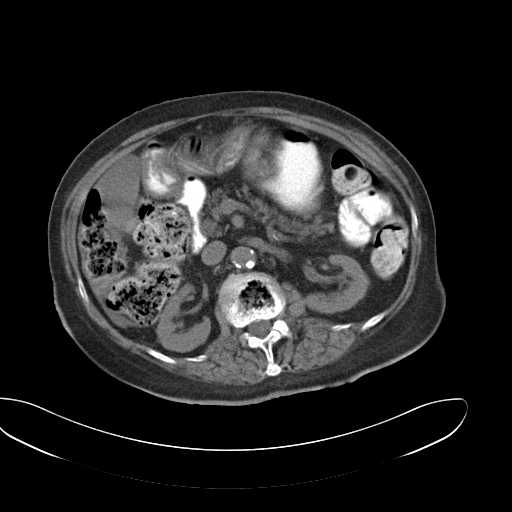
[im 66/88  soft-tissue]
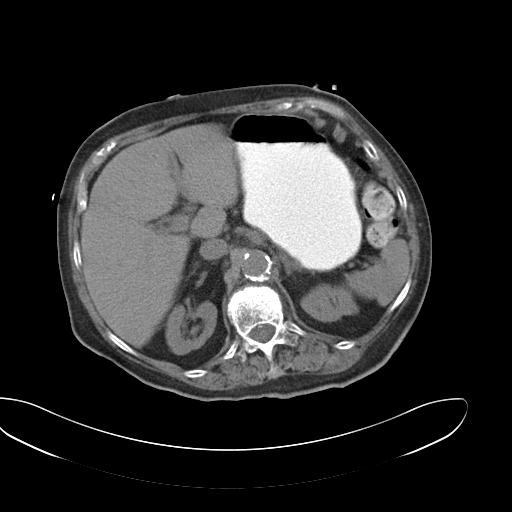
[im 73/88  soft-tissue]
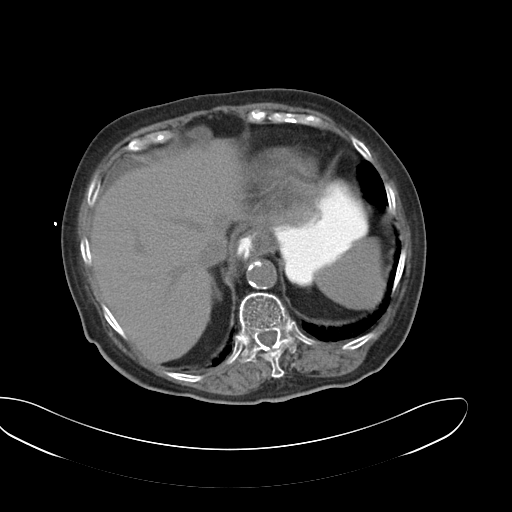
[im 77/88  soft-tissue]
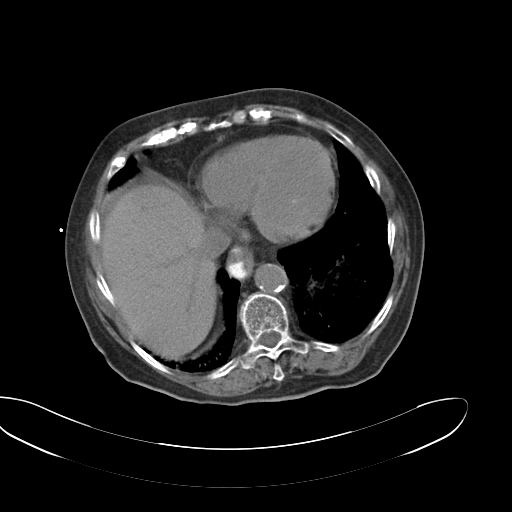
[im 84/88  soft-tissue]
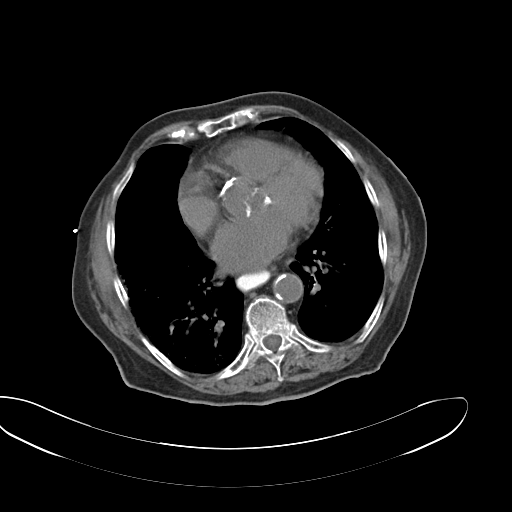

[Series 5: cor routine abd pel wo · coronal · 0.89mm/px · 3 of 139 slices shown]
[im 47/139  soft-tissue]
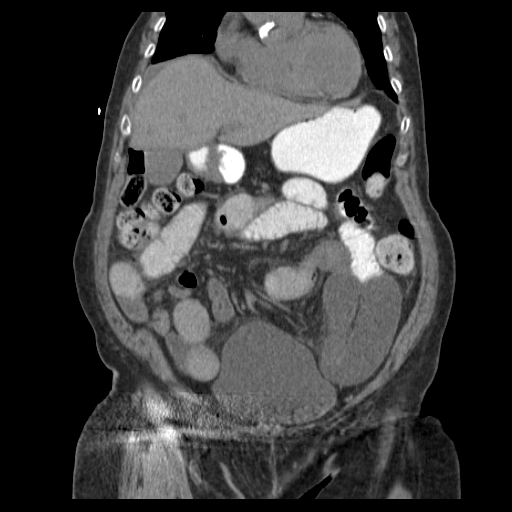
[im 62/139  soft-tissue]
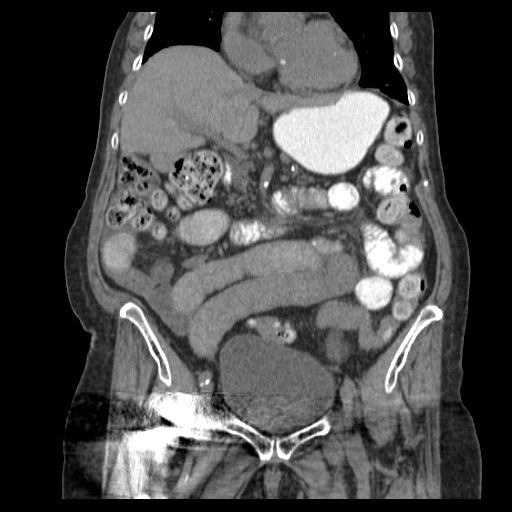
[im 77/139  soft-tissue]
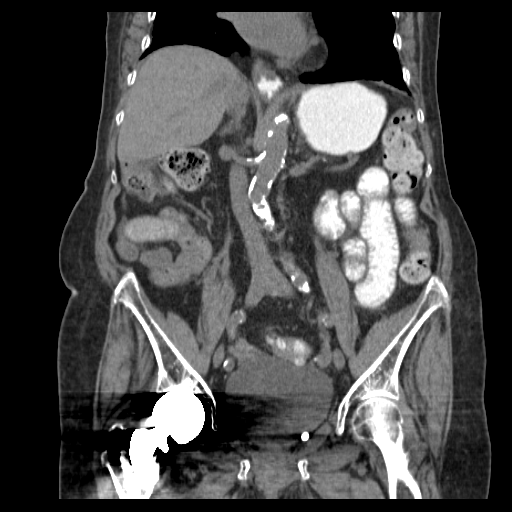

[17 of 46 positions shown; findings below may reference images not displayed]

FINDINGS: Process small hiatal hernia. Heart is normal size. Dependent
ground-glass opacities in the lungs, likely atelectasis. No
effusions. Coronary artery calcifications and stents noted. Oral
contrast material noted within the esophagus, possibly related to
dysmotility or reflux.

Liver, gallbladder, spleen, pancreas, left adrenal gland, kidneys
have an unremarkable unenhanced appearance. Small low-density nodule
in the right adrenal gland compatible with adenoma.

There is a small amount of free fluid adjacent to the liver and in
the right paracolic gutter. Mildly dilated, predominantly
fluid-filled small bowel loops in the abdomen and pelvis. Distal
small bowel loops are decompressed. Exact transition is not
visualized, but findings compatible with partial small bowel
obstruction. Sigmoid diverticulosis. No active diverticulitis. Large
stool burden throughout the colon.

Aorta and iliac vessels are heavily calcified, non aneurysmal. No
free air or adenopathy.

No acute bony abnormality or focal bone lesion. Degenerative changes
throughout the lumbar spine. Prior right hip replacement.
IMPRESSION: Dilated, fluid-filled small bowel loops with distal small bowel
loops decompressed. Findings compatible with partial small bowel
obstruction. Exact transition point not visualized.

Small amount of free fluid in the abdomen and pelvis.

Sigmoid diverticulosis.

Small hiatal hernia.

## 2016-12-18 IMAGING — CR DG ABDOMEN 2V
1 series · 2 of 2 positions shown · non-contrast
Comparison: 02/21/2015

CLINICAL DATA: Small bowel obstruction

EXAM:
ABDOMEN - 2 VIEW

[Series 1: dxr abdomen 2 v flat and erect · 0.14mm/px · 2 of 2 slices shown]
[im 1/2]
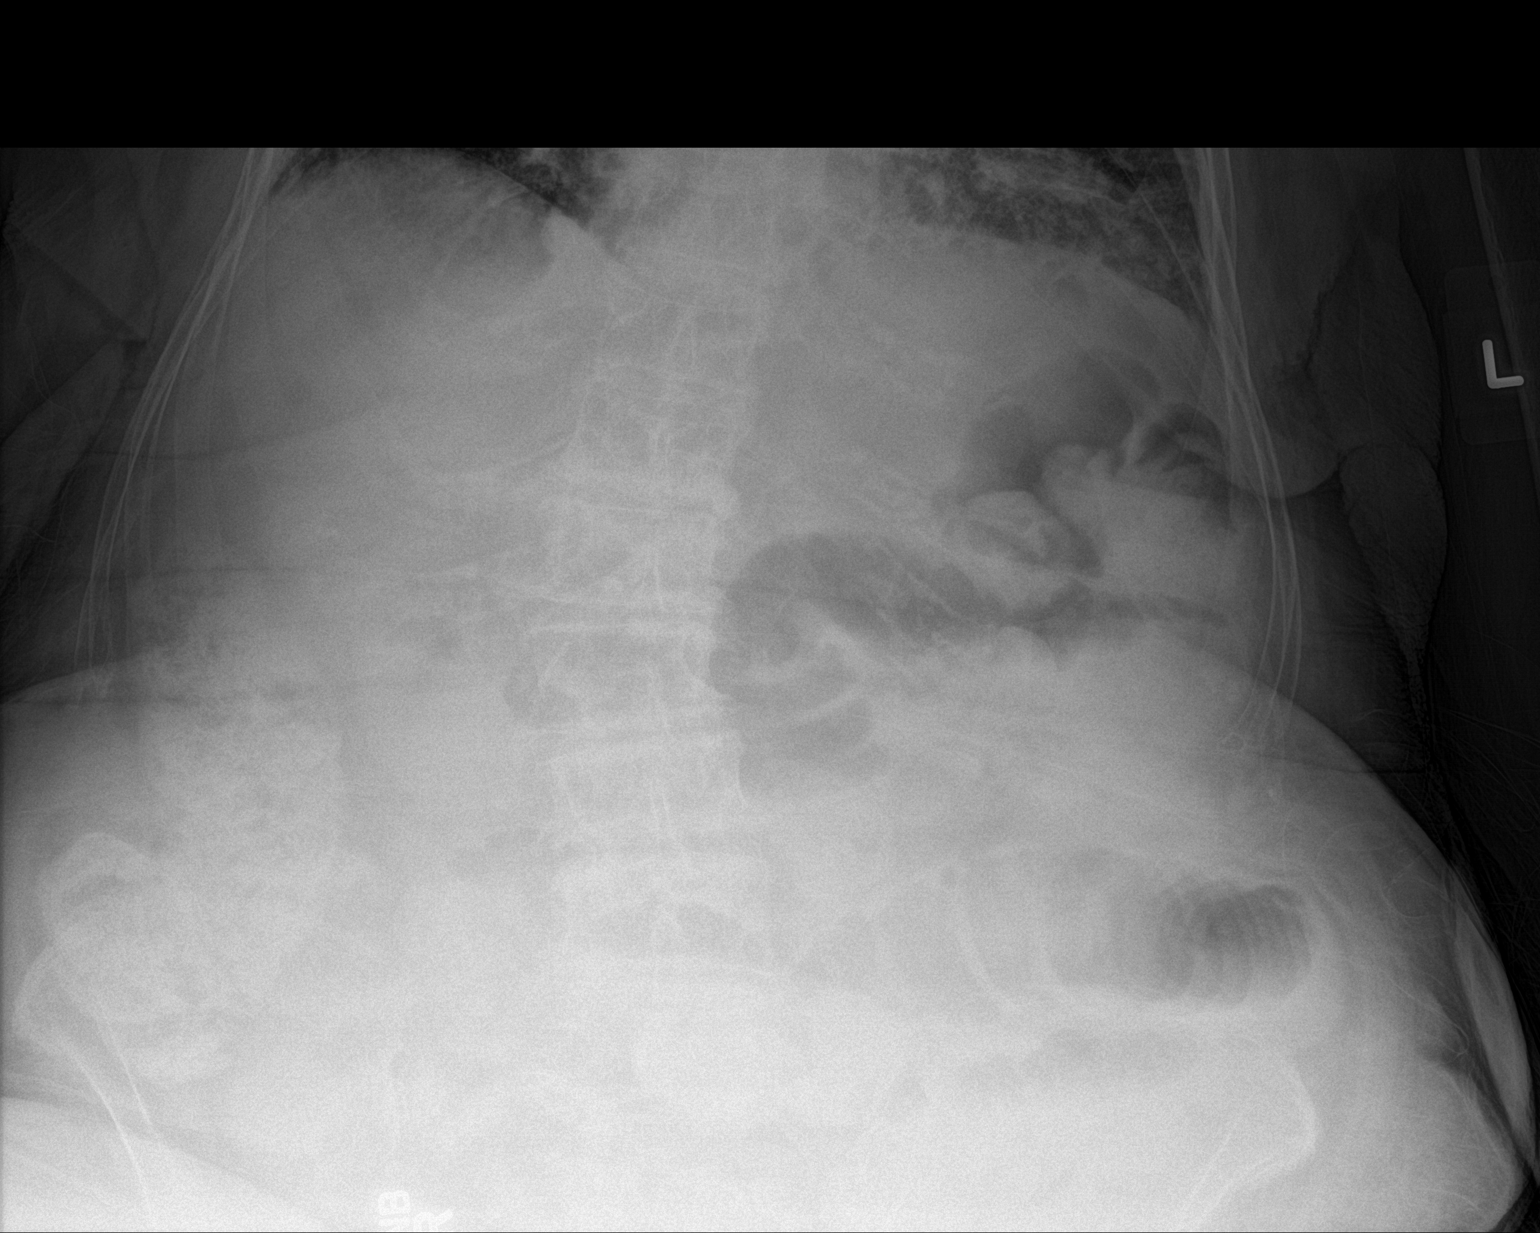
[im 2/2]
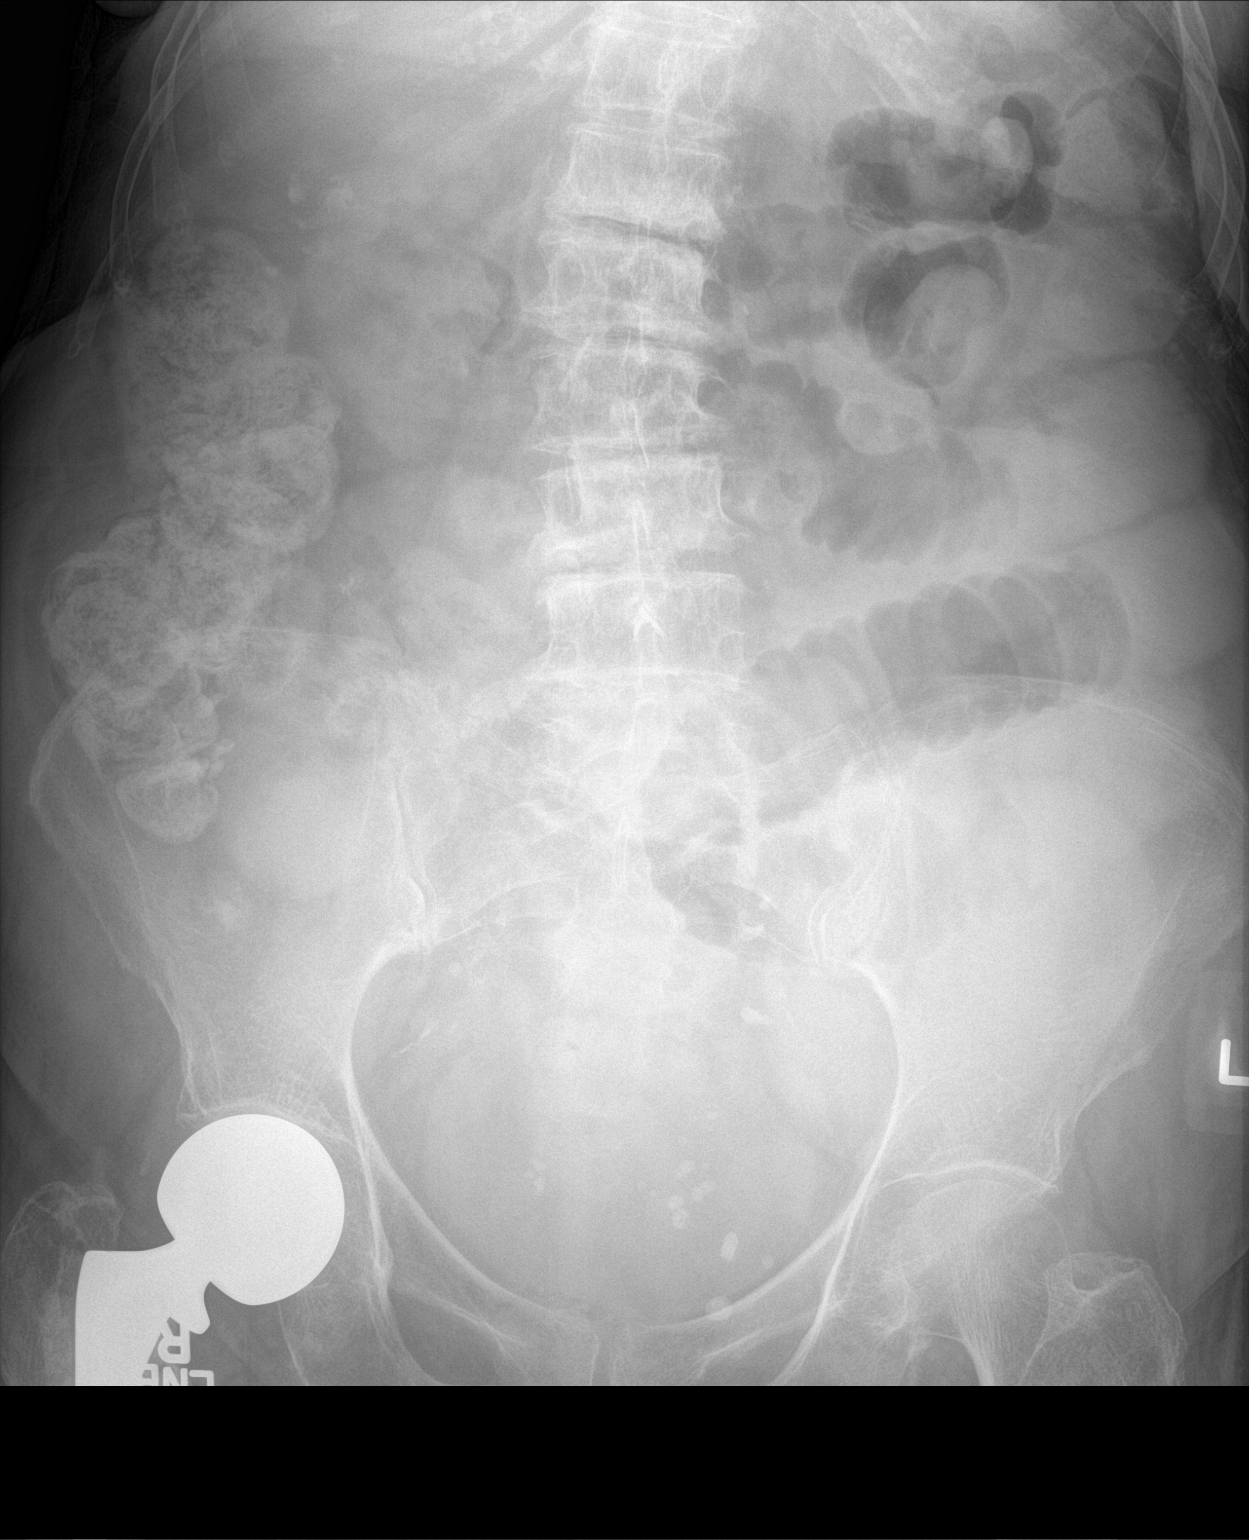

[2 of 2 positions shown; findings below may reference images not displayed]

FINDINGS: Nasogastric tube has been removed. No free air beneath the
diaphragms. Persistent dilatation of left mid abdominal small bowel
loops, maximal diameter 4.5 cm. Colon is decompressed. Right total
hip arthroplasty partly visualized. Lumbar spine degenerative change
noted.
IMPRESSION: Persistent left mid abdominal small bowel dilatation compatible with
small bowel obstruction or focal ileus.

## 2016-12-20 IMAGING — CR DG CHEST 1V PORT
1 series · 1 of 1 positions shown · non-contrast
Comparison: 02/20/2015

CLINICAL DATA: Expiratory wheezing.  Thick secretions.

EXAM:
PORTABLE CHEST - 1 VIEW

[ap]
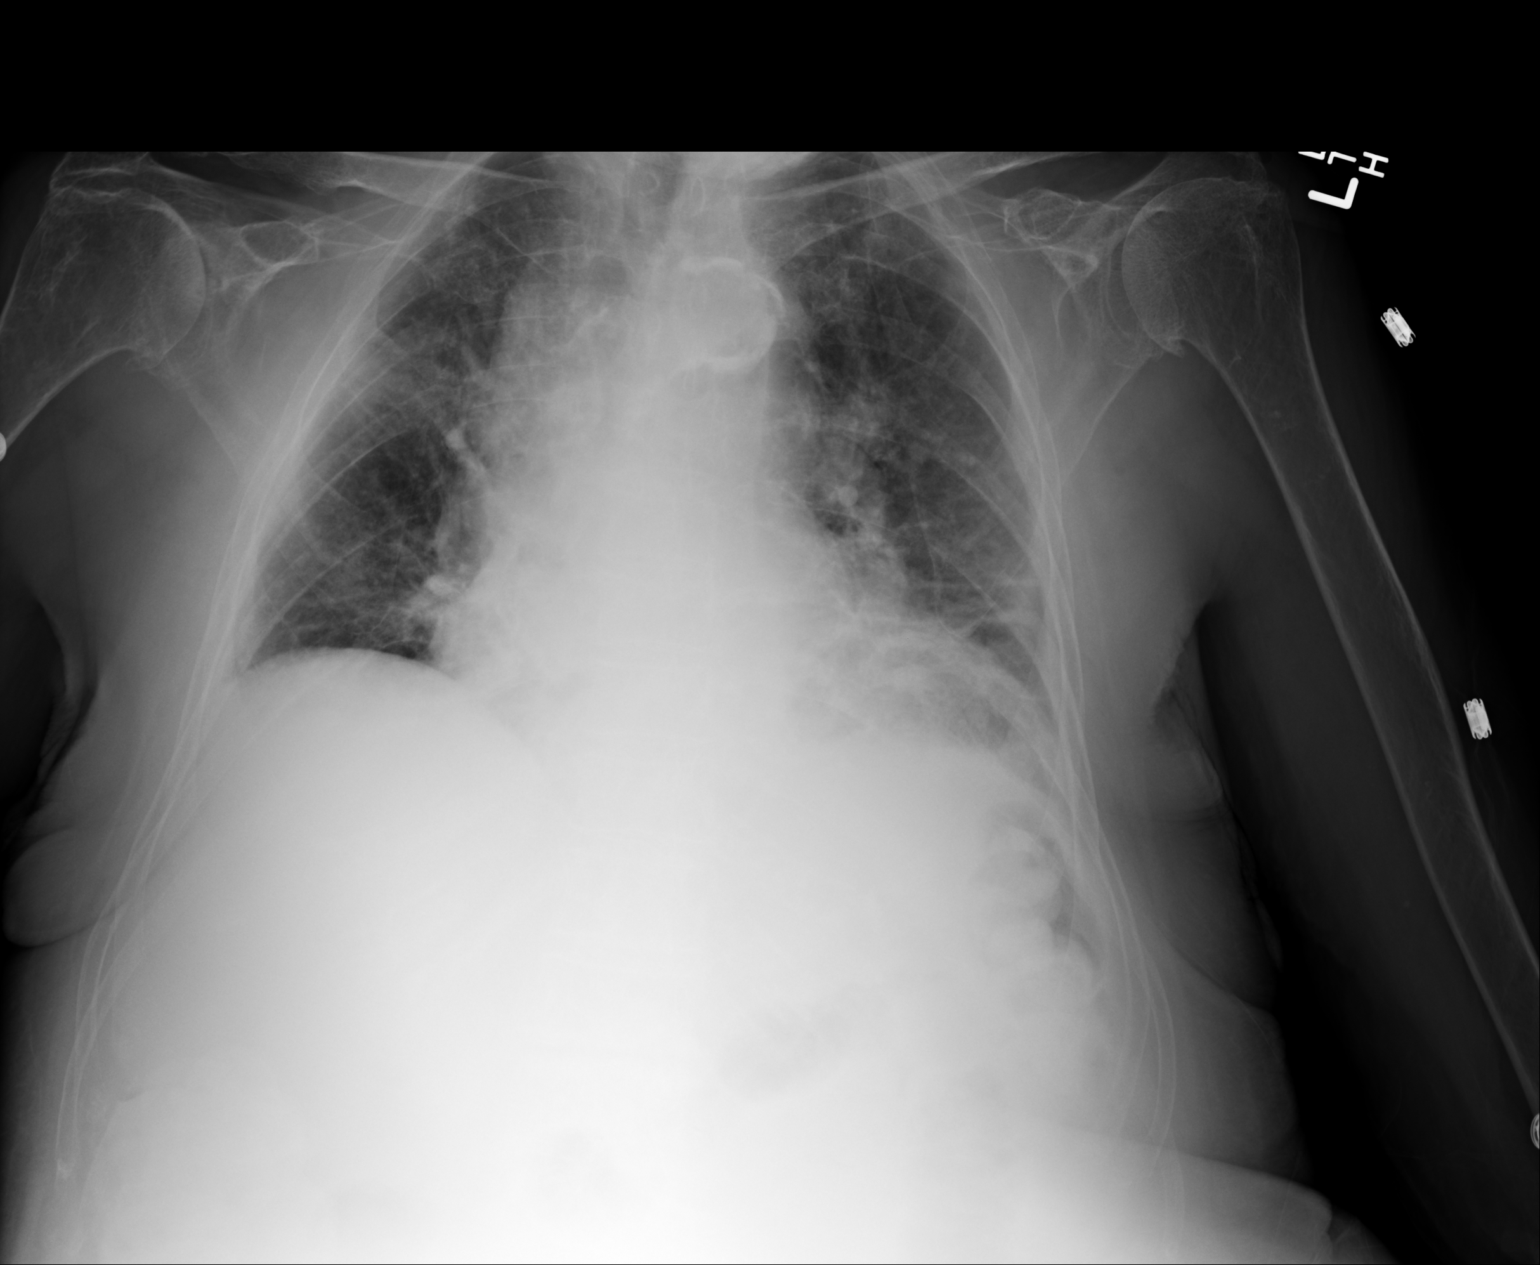

[1 of 1 positions shown; findings below may reference images not displayed]

FINDINGS: There is chronic cardiomegaly. There is new pulmonary vascular
congestion with areas of atelectasis at the left lung base and in
the right midzone. There is a new tiny left effusion. There is
calcification and tortuosity of the thoracic aorta. No acute osseous
abnormality.
IMPRESSION: New pulmonary vascular congestion with atelectasis in the right
midzone and left base. New tiny left effusion.

## 2016-12-22 IMAGING — CR DG ABDOMEN 2V
1 series · 3 of 3 positions shown · non-contrast
Comparison: 02/23/2015

CLINICAL DATA: Constipation. Productive cough, abdominal
distention.

EXAM:
ABDOMEN - 2 VIEW

[Series 1: dxr abdomen 2 v flat and erect · 0.14mm/px · 3 of 3 slices shown]
[im 1/3]
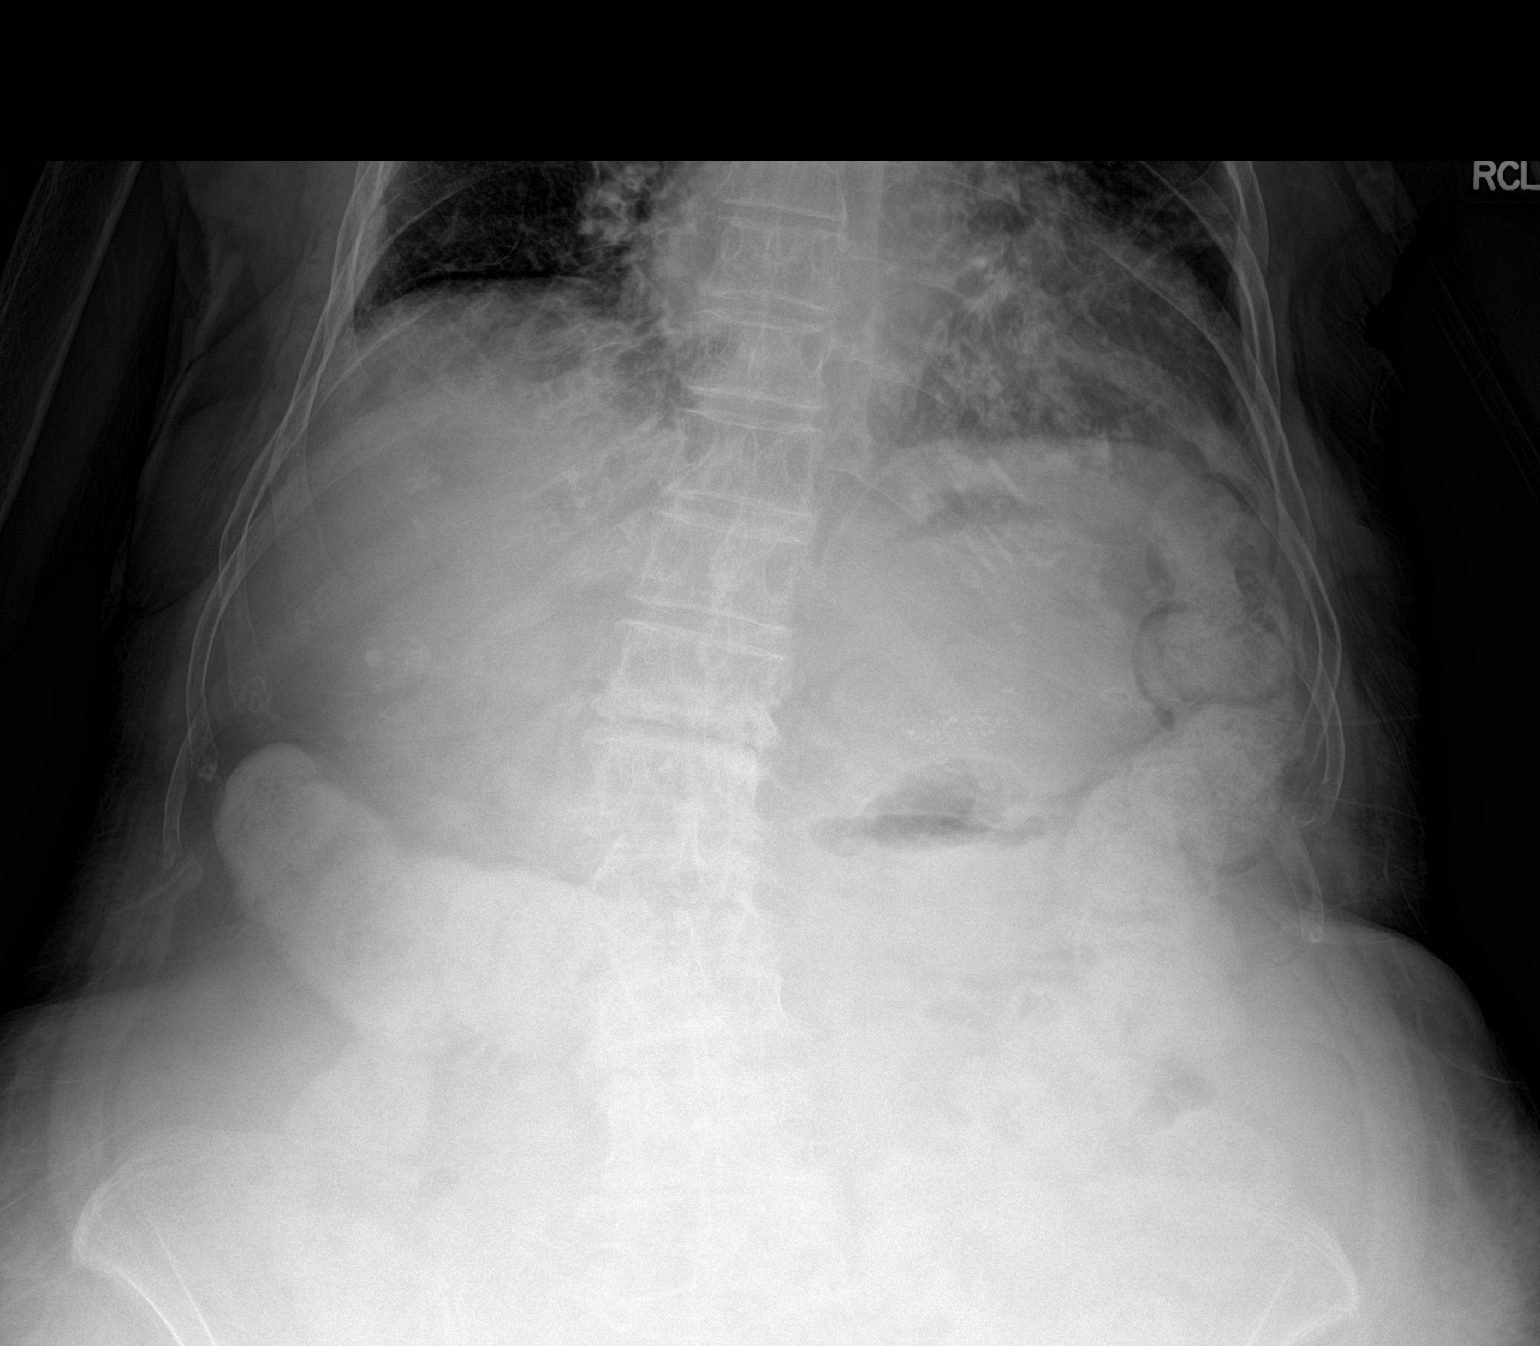
[im 2/3]
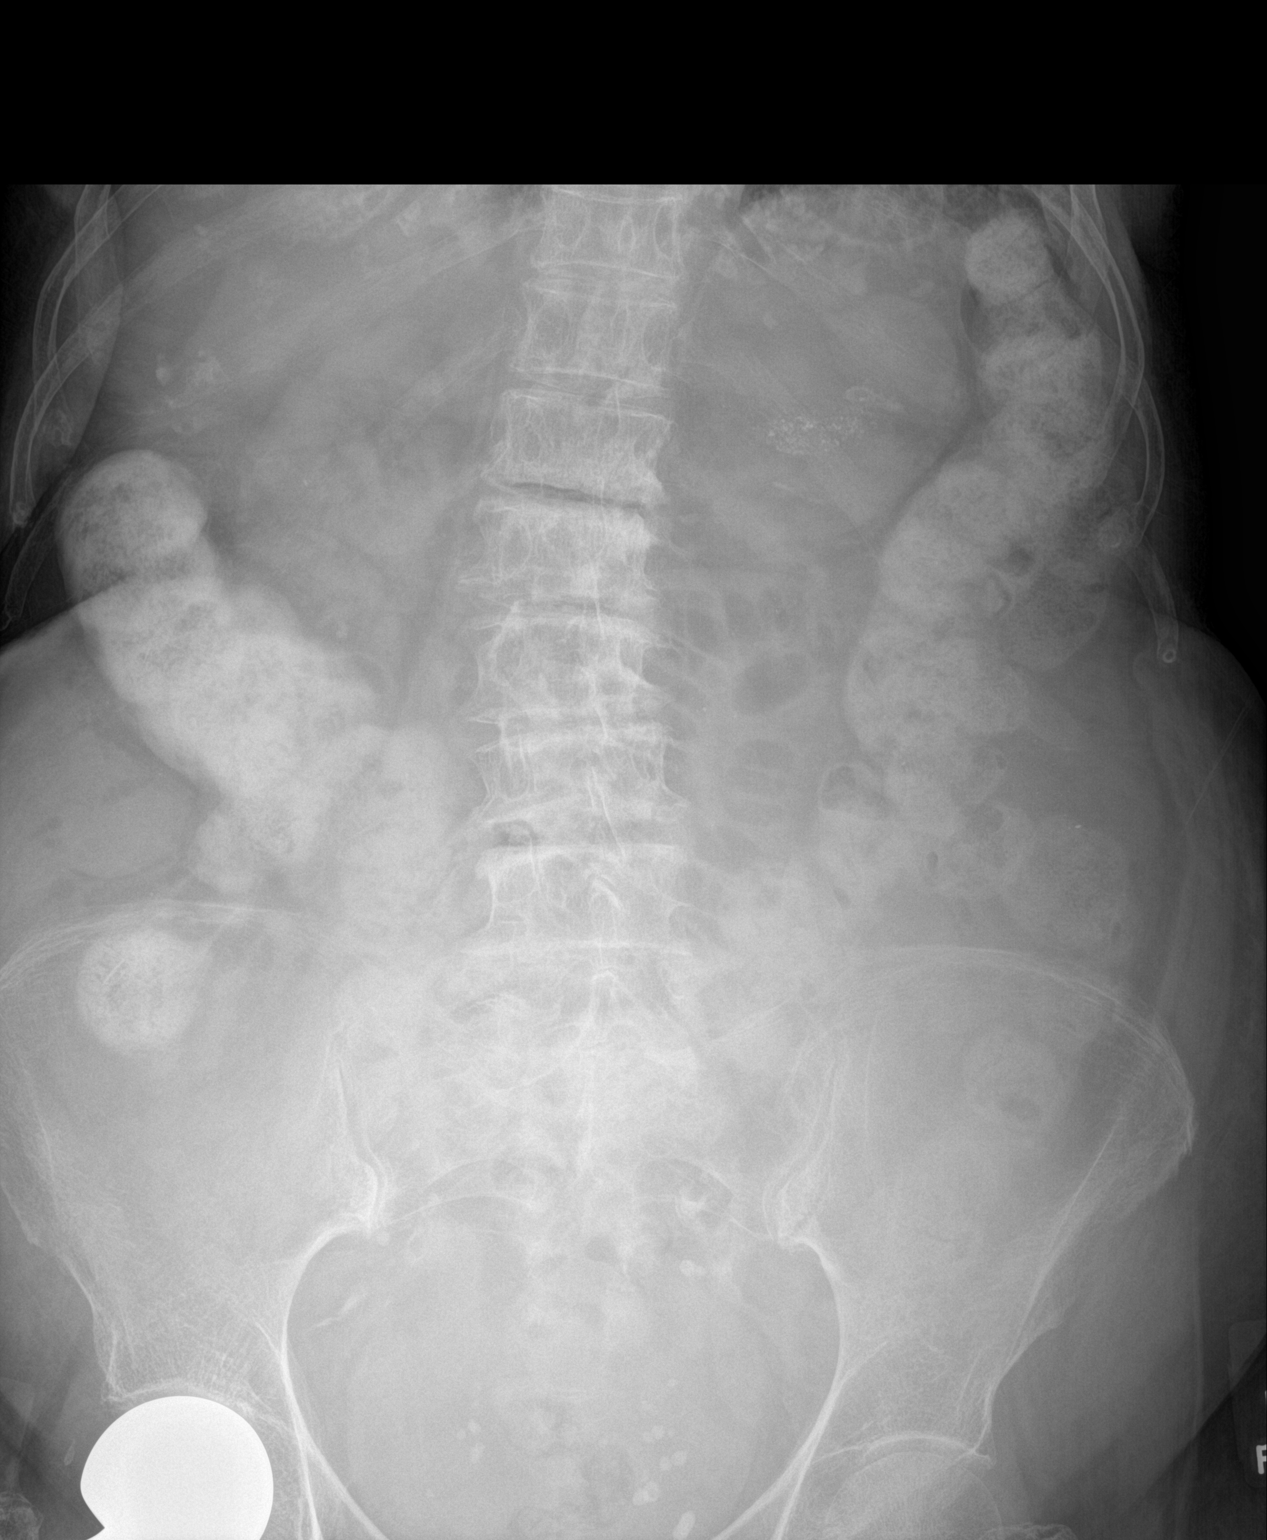
[im 3/3]
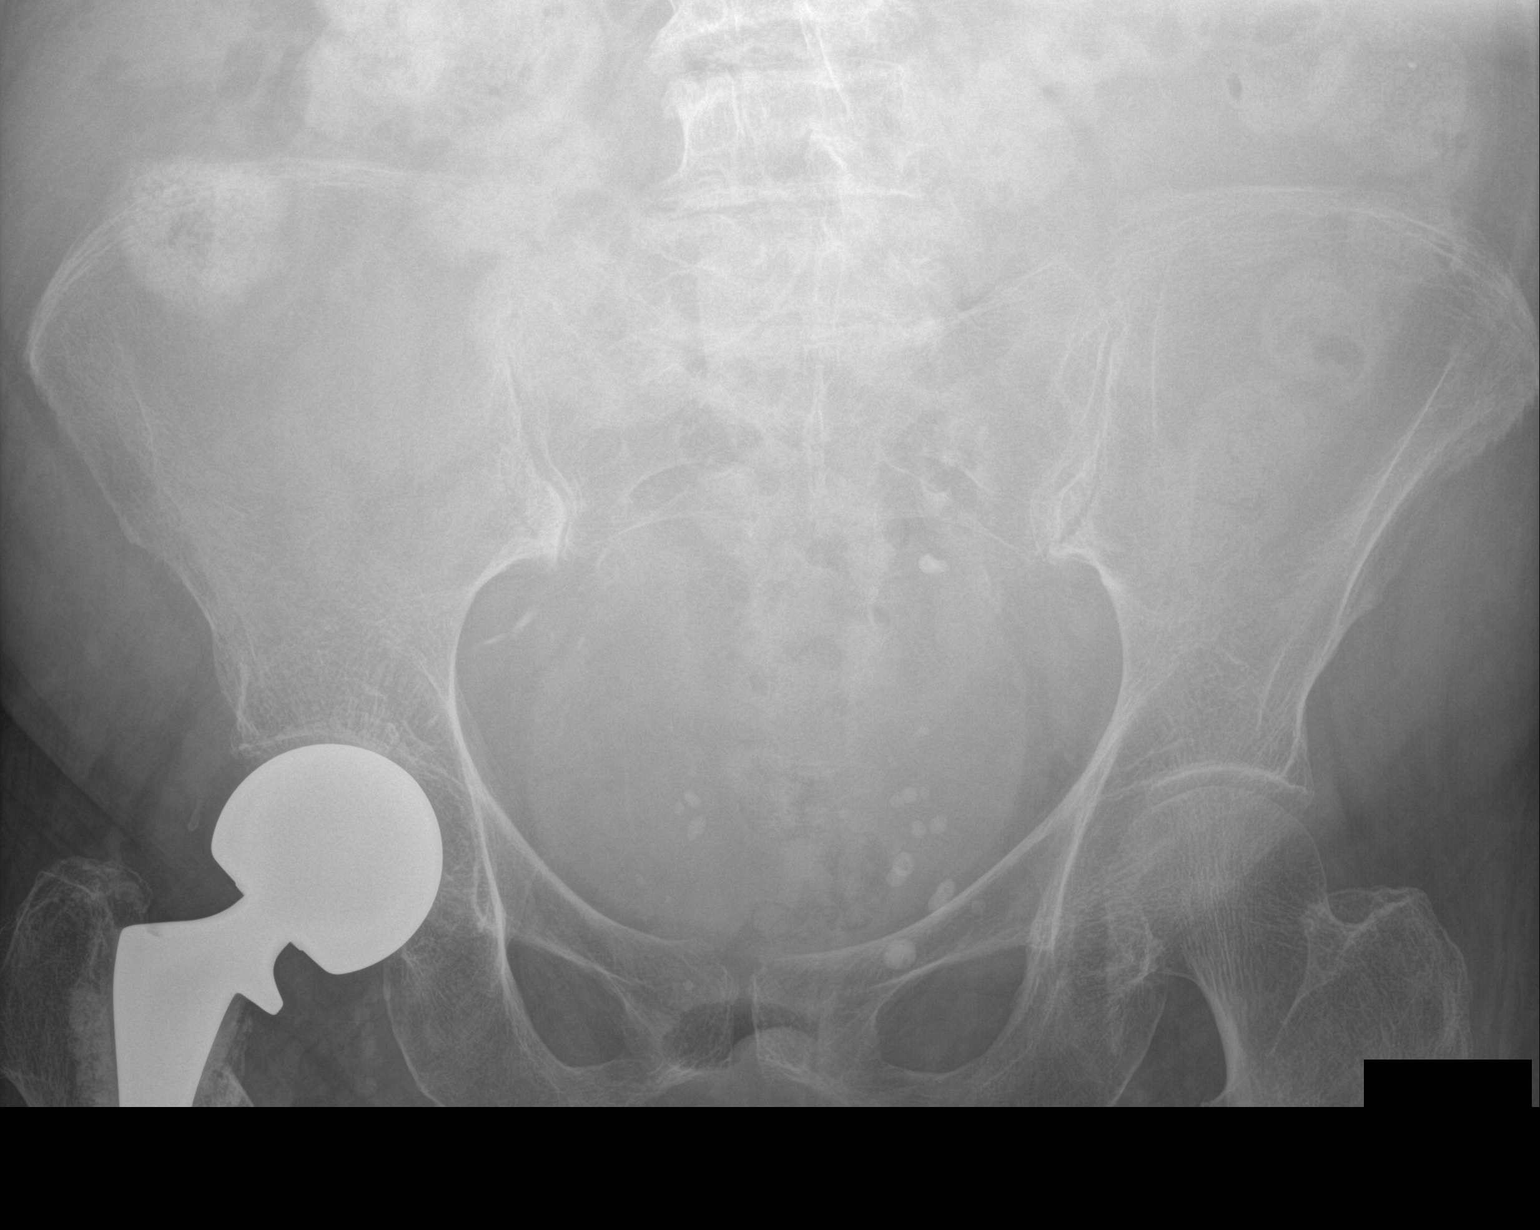

[3 of 3 positions shown; findings below may reference images not displayed]

FINDINGS: Contrast material noted within the colon. Decreasing small bowel
distention since prior study. No current evidence of obstruction. No
free air organomegaly.
IMPRESSION: Resolution of previously seen small bowel obstruction pattern.
